# Patient Record
Sex: Female | Born: 1965 | Race: Black or African American | Hispanic: No | Marital: Single | Smoking: Former smoker
Health system: Southern US, Community
[De-identification: ages and names within clinical notes are randomized; demographics above are authoritative.]

## PROBLEM LIST (undated history)

## (undated) DIAGNOSIS — L309 Dermatitis, unspecified: Secondary | ICD-10-CM

## (undated) DIAGNOSIS — F419 Anxiety disorder, unspecified: Secondary | ICD-10-CM

## (undated) DIAGNOSIS — Z923 Personal history of irradiation: Secondary | ICD-10-CM

## (undated) DIAGNOSIS — N6310 Unspecified lump in the right breast, unspecified quadrant: Secondary | ICD-10-CM

## (undated) HISTORY — PX: ELBOW SURGERY: SHX618

## (undated) HISTORY — PX: TUBAL LIGATION: SHX77

---

## 2005-07-04 ENCOUNTER — Ambulatory Visit: Payer: Self-pay | Admitting: Internal Medicine

## 2005-08-03 ENCOUNTER — Ambulatory Visit: Payer: Self-pay | Admitting: Internal Medicine

## 2006-02-20 ENCOUNTER — Ambulatory Visit: Payer: Self-pay | Admitting: Internal Medicine

## 2006-09-06 ENCOUNTER — Inpatient Hospital Stay (HOSPITAL_COMMUNITY): Admission: AD | Admit: 2006-09-06 | Discharge: 2006-09-06 | Payer: Self-pay | Admitting: Obstetrics & Gynecology

## 2006-09-08 ENCOUNTER — Inpatient Hospital Stay (HOSPITAL_COMMUNITY): Admission: AD | Admit: 2006-09-08 | Discharge: 2006-09-08 | Payer: Self-pay | Admitting: Family Medicine

## 2006-09-09 ENCOUNTER — Encounter (INDEPENDENT_AMBULATORY_CARE_PROVIDER_SITE_OTHER): Payer: Self-pay | Admitting: *Deleted

## 2006-09-09 ENCOUNTER — Ambulatory Visit (HOSPITAL_COMMUNITY): Admission: AD | Admit: 2006-09-09 | Discharge: 2006-09-09 | Payer: Self-pay | Admitting: Family Medicine

## 2007-01-08 ENCOUNTER — Inpatient Hospital Stay (HOSPITAL_COMMUNITY): Admission: AD | Admit: 2007-01-08 | Discharge: 2007-01-08 | Payer: Self-pay | Admitting: Obstetrics

## 2007-01-12 ENCOUNTER — Ambulatory Visit (HOSPITAL_COMMUNITY): Admission: RE | Admit: 2007-01-12 | Discharge: 2007-01-12 | Payer: Self-pay | Admitting: Obstetrics

## 2007-01-12 ENCOUNTER — Encounter (INDEPENDENT_AMBULATORY_CARE_PROVIDER_SITE_OTHER): Payer: Self-pay | Admitting: Specialist

## 2007-05-22 ENCOUNTER — Ambulatory Visit (HOSPITAL_COMMUNITY): Admission: RE | Admit: 2007-05-22 | Discharge: 2007-05-22 | Payer: Self-pay | Admitting: Obstetrics

## 2007-08-28 ENCOUNTER — Inpatient Hospital Stay (HOSPITAL_COMMUNITY): Admission: AD | Admit: 2007-08-28 | Discharge: 2007-08-29 | Payer: Self-pay | Admitting: Obstetrics

## 2007-09-01 ENCOUNTER — Inpatient Hospital Stay (HOSPITAL_COMMUNITY): Admission: AD | Admit: 2007-09-01 | Discharge: 2007-09-01 | Payer: Self-pay | Admitting: Obstetrics

## 2007-10-01 ENCOUNTER — Inpatient Hospital Stay (HOSPITAL_COMMUNITY): Admission: AD | Admit: 2007-10-01 | Discharge: 2007-10-11 | Payer: Self-pay | Admitting: Obstetrics

## 2007-10-09 ENCOUNTER — Encounter (INDEPENDENT_AMBULATORY_CARE_PROVIDER_SITE_OTHER): Payer: Self-pay | Admitting: Obstetrics

## 2007-10-10 ENCOUNTER — Encounter (INDEPENDENT_AMBULATORY_CARE_PROVIDER_SITE_OTHER): Payer: Self-pay | Admitting: Obstetrics

## 2008-01-26 ENCOUNTER — Emergency Department (HOSPITAL_COMMUNITY): Admission: EM | Admit: 2008-01-26 | Discharge: 2008-01-26 | Payer: Self-pay | Admitting: Family Medicine

## 2008-08-22 ENCOUNTER — Encounter: Admission: RE | Admit: 2008-08-22 | Discharge: 2008-08-22 | Payer: Self-pay | Admitting: Internal Medicine

## 2008-10-18 ENCOUNTER — Emergency Department (HOSPITAL_BASED_OUTPATIENT_CLINIC_OR_DEPARTMENT_OTHER): Admission: EM | Admit: 2008-10-18 | Discharge: 2008-10-18 | Payer: Self-pay | Admitting: Emergency Medicine

## 2008-11-13 ENCOUNTER — Encounter: Admission: RE | Admit: 2008-11-13 | Discharge: 2008-11-13 | Payer: Self-pay | Admitting: Internal Medicine

## 2010-03-12 ENCOUNTER — Encounter: Admission: RE | Admit: 2010-03-12 | Discharge: 2010-03-12 | Payer: Self-pay | Admitting: Internal Medicine

## 2010-03-30 ENCOUNTER — Ambulatory Visit: Payer: Self-pay | Admitting: Diagnostic Radiology

## 2010-03-30 ENCOUNTER — Emergency Department (HOSPITAL_BASED_OUTPATIENT_CLINIC_OR_DEPARTMENT_OTHER): Admission: EM | Admit: 2010-03-30 | Discharge: 2010-03-30 | Payer: Self-pay | Admitting: Emergency Medicine

## 2011-04-12 NOTE — Op Note (Signed)
NAMESELYNA, Cassidy Mayer               ACCOUNT NO.:  [id number]   MEDICAL RECORD NO.:  [id number]          PATIENT TYPE:  INP   LOCATION:  9307                          FACILITY:  [facility]   PHYSICIAN:  Kathreen Cosier, M.D.DATE OF BIRTH:  1966-09-12   DATE OF PROCEDURE:  10/10/2007  DATE OF DISCHARGE:                               OPERATIVE REPORT   PREOPERATIVE DIAGNOSIS:  Multiparity.   POSTOPERATIVE DIAGNOSIS:  Multiparity.   DESCRIPTION OF PROCEDURE:  The patient was placed on the operating table  in the supine position.  General anesthesia administered by Dr. Jean Rosenthal.  Abdomen prepped and draped.  Bladder emptied with straight catheter.  A  midline 1 inch incision subumbilically was made and carried down to the  fascia.  The fascia was cleaned and grasped with two Kochers.  The  fascia and the peritoneum opened with the Mayo scissors.  The left tube  grasped in the midportion with a Babcock clamp.  0 plain suture placed  in the mesosalpinx below the portion of the tube within the clamp.  This  was tied and the tube traced to the fimbria.  Approximately one inch of  tube was transected.  Hemostasis was satisfactory.  The procedure was  done in a similar fashion on the other side.  Lap and sponge counts  correct, abdomen closed in layers.  Peritoneum and fascia with  continuous suture of 0 Dexon.  The skin was closed with subcuticular  stitch of 4-0 Monocryl.  Estimated blood loss was minimal.  The patient  tolerated the procedure well and was taken to the recovery room in good  condition.           ______________________________  Kathreen Cosier, M.D.     BAM/MEDQ  D:  10/10/2007  T:  10/10/2007  Job:  [id number]

## 2011-04-12 NOTE — Op Note (Signed)
Cassidy Mayer, [name]               ACCOUNT NO.:  [id number]   MEDICAL RECORD NO.:  [id number]          PATIENT TYPE:  AMB   LOCATION:  SDC                           [facility]:  [facility]   PHYSICIAN:  Kathreen Cosier, M.D.DATE OF BIRTH:  16-Jul-1966   DATE OF PROCEDURE:  05/22/2007  DATE OF DISCHARGE:                               OPERATIVE REPORT   PREOPERATIVE DIAGNOSIS:  Incompetent cervix.   PROCEDURE:  Cervical cerclage.   ANESTHESIA:  Spinal.   PROCEDURE NOTE:  The patient was placed in the lithotomy position.  Perineum and vagina prepped and draped. Bladder emptied with a straight  catheter. Weighted speculum placed in the vagina starting at 12 o'clock  high up on the lateral aspect of the cervix using a #5 Mersilene band  __________  suture was placed in the usual manner and tied at 6 o'clock.  The patient tolerated the procedure well and was taken to the recovery  room in good condition.           ______________________________  Kathreen Cosier, M.D.     BAM/MEDQ  D:  05/22/2007  T:  05/22/2007  Job:  [id number]

## 2011-04-12 NOTE — Discharge Summary (Signed)
Cassidy Mayer, Cassidy Mayer               ACCOUNT NO.:  [id number]   MEDICAL RECORD NO.:  [id number]          PATIENT TYPE:  INP   LOCATION:  9307                          [facility]   PHYSICIAN:  Kathreen Cosier, M.D.DATE OF BIRTH:  1965-12-02   DATE OF ADMISSION:  10/01/2007  DATE OF DISCHARGE:  10/11/2007                               DISCHARGE SUMMARY   HOSPITAL COURSE:  The patient is a 45 year old gravida 5, para 1-1-2-2  with EDC of November 17, 2007.  She was admitted at 32 weeks with  ruptured membranes.  The fluid was clear, and she had 3.7 to 1 LSPG.  The cerclage was removed on admission, and she was given dexamethasone 6  mg IM q.12 h. times four.  She was also placed on magnesium sulfate.  Her cervix was 3 cm.  She also has a history of ulcers and is on  Carafate 4 mg q.i.d., Pepcid 20 mg b.i.d.  She also suffers from  anxiety.  She was on Xanax 0.5 t.i.d.  The patient continued leaking and  remained afebrile.  Her hemoglobin was 10.5.  Platelet counts were noted  to be 81,000 down from 152,000 early in her pregnancy.  She went into  labor on October 09, 2007 and had a normal vaginal delivery of female  with Apgars of 8 and 9, weighing 3 pounds 14 ounces.  The patient  desired sterilization and on November 12 underwent postpartum tubal  ligation.  She did fine, and was discharged home on October 11, 2007,  ambulatory on a regular diet on Tylox for pain and Xanax 0.5 t.i.d. for  anxiety.   DISCHARGE DIAGNOSIS:  1. Status post normal vaginal delivery at 34 weeks, premature ruptured      membranes.  2. Postpartum tubal ligation.           ______________________________  Kathreen Cosier, M.D.     BAM/MEDQ  D:  10/11/2007  T:  10/11/2007  Job:  [id number]

## 2011-04-15 NOTE — Op Note (Signed)
NAMELILLIANNE, Cassidy Mayer               ACCOUNT NO.:  [id number]   MEDICAL RECORD NO.:  [id number]          PATIENT TYPE:  AMB   LOCATION:  SDC                           [facility]   PHYSICIAN:  Juan H. Lily Peer, M.D.DATE OF BIRTH:  1966/02/27   DATE OF PROCEDURE:  09/09/2006  DATE OF DISCHARGE:  09/09/2006                                 OPERATIVE REPORT   INDICATIONS FOR OPERATION:  A 45 year old gravida 3, para 2, at 6-1/[redacted] weeks  gestation with evidence of a missed AB, leveling off of quantitative beta  HCGs, gestational sac in the lower intrauterine cavity irregular, and  patient complaining of bleeding and cramping.   PREOPERATIVE DIAGNOSIS:  First trimester missed abortion.   POSTOPERATIVE DIAGNOSIS:  First trimester missed abortion.   ANESTHESIA:  Intravenous sedation (MAC).   PROCEDURE PERFORMED:  Dilatation and evacuation.   DESCRIPTION OF PROCEDURE:  After the patient was adequately counseled, she  was taken to the operating room, where she underwent intravenous sedation.  The patient received 2 gm of Ancef prophylactically.  She had been bleeding  and cramping for several days.  She had voided prior to coming to the  operating room.  Examination demonstrated the uterus at upper limits of  normal, retroverted, with no palpable adnexal masses.  The vagina and  perineum were prepped and draped in the usual sterile fashion.  A Graves  speculum was inserted into the vaginal vault.  A single-tooth tenaculum was  placed into the anterior cervical lip.  The uterus sounded to approximately  8 cm and was dilated with Shawnie Pons dilators in an effort to allow a 9 mm  suction curette to be introduced into the intrauterine cavity.  Prior to  this, 2% lidocaine with 1:100,000 epinephrine was infiltrated into the  cervical vaginal stroma at the 2, 4, 8, and 10 o'clock position for a total  of 10 cc.  The 9 mm suction curette had been introduced into the  intrauterine cavity, and the  products of conception were removed.  This was  interchanged with a serrated curette to completely evacuate the uterus of  its contents.  The patient tolerated the procedure well, was transferred to  the recovery room with stable vital signs.  Blood loss was minimal.  IV  fluids, 900 cc of lactated Ringer's, and her blood type was O+.      Juan H. Lily Peer, M.D.  Electronically Signed     JHF/MEDQ  D:  09/09/2006  T:  09/11/2006  Job:  [id number]

## 2011-04-15 NOTE — Op Note (Signed)
NAMETAYTEN, HEBER               ACCOUNT NO.:  [id number]   MEDICAL RECORD NO.:  [id number]          PATIENT TYPE:  AMB   LOCATION:  SDC                           [facility]   PHYSICIAN:  Kathreen Cosier, M.D.DATE OF BIRTH:  June 10, 1966   DATE OF PROCEDURE:  01/12/2007  DATE OF DISCHARGE:                               OPERATIVE REPORT   PREOPERATIVE DIAGNOSIS:  Six week fetal demise.   PROCEDURE:  Dilation and evacuation, using MAC.   Patient in lithotomy position.  Perineum and vagina prepped and draped.  Bladder emptied with a straight catheter.  Bimanual exam revealed uterus  to be retroverted, normal uterus.  A large amount of clots and tissue in  the vagina.  The cervix was noted to be wide open and easily admitted a  #9 suction, and the endometrial cavity __________ .  Patient tolerated  the procedure well, taken to the recovery room in good condition.           ______________________________  Kathreen Cosier, M.D.     BAM/MEDQ  D:  01/12/2007  T:  01/12/2007  Job:  [id number]

## 2011-09-06 LAB — CBC
HCT: 32.3 — ABNORMAL LOW
HCT: 34.5 — ABNORMAL LOW
Hemoglobin: 11.5 — ABNORMAL LOW
MCHC: 33.4
MCV: 86.5
MCV: 86.6
Platelets: 80 — ABNORMAL LOW
RBC: 3.73 — ABNORMAL LOW
RBC: 3.99
RDW: 13.8
WBC: 10.3
WBC: 5.7

## 2011-09-06 LAB — COMPREHENSIVE METABOLIC PANEL
AST: 22
Albumin: 2.3 — ABNORMAL LOW
Calcium: 8.3 — ABNORMAL LOW
Chloride: 104
Creatinine, Ser: 0.57
GFR calc Af Amer: >60
Total Protein: 5.7 — ABNORMAL LOW

## 2011-09-06 LAB — LSPG (L/S RATIO WITH PG)-AMNIO FLUID

## 2011-09-06 LAB — RPR TITER: RPR Titer: 1:2 {titer} — AB

## 2011-09-06 LAB — RPR: RPR Ser Ql: REACTIVE — AB

## 2011-09-14 LAB — CBC
HCT: 36
Hemoglobin: 12.2
MCV: 82.7
Platelets: 155
RDW: 12.2

## 2011-09-23 LAB — TPPA: Treponema Confirm: NONREACTIVE

## 2012-02-14 ENCOUNTER — Other Ambulatory Visit: Payer: Self-pay | Admitting: Internal Medicine

## 2012-02-14 ENCOUNTER — Ambulatory Visit
Admission: RE | Admit: 2012-02-14 | Discharge: 2012-02-14 | Disposition: A | Discharge status: Home / Self Care | Payer: Medicaid Other | Source: Ambulatory Visit | Attending: Internal Medicine | Admitting: Internal Medicine

## 2012-02-14 DIAGNOSIS — F17200 Nicotine dependence, unspecified, uncomplicated: Secondary | ICD-10-CM

## 2012-02-14 DIAGNOSIS — M549 Dorsalgia, unspecified: Secondary | ICD-10-CM

## 2014-12-25 ENCOUNTER — Encounter (HOSPITAL_BASED_OUTPATIENT_CLINIC_OR_DEPARTMENT_OTHER): Payer: Self-pay | Admitting: *Deleted

## 2014-12-25 ENCOUNTER — Emergency Department (HOSPITAL_BASED_OUTPATIENT_CLINIC_OR_DEPARTMENT_OTHER)
Admission: EM | Admit: 2014-12-25 | Discharge: 2014-12-25 | Disposition: A | Discharge status: Home / Self Care | Payer: BLUE CROSS/BLUE SHIELD | Attending: Emergency Medicine | Admitting: Emergency Medicine

## 2014-12-25 ENCOUNTER — Emergency Department (HOSPITAL_BASED_OUTPATIENT_CLINIC_OR_DEPARTMENT_OTHER): Payer: BLUE CROSS/BLUE SHIELD

## 2014-12-25 DIAGNOSIS — L03011 Cellulitis of right finger: Secondary | ICD-10-CM | POA: Insufficient documentation

## 2014-12-25 HISTORY — DX: Dermatitis, unspecified: L30.9

## 2014-12-25 LAB — CBC
HCT: 41.3 % (ref 36.0–46.0)
HEMOGLOBIN: 13.2 g/dL (ref 12.0–15.0)
MCH: 27.4 pg (ref 26.0–34.0)
MCHC: 32 g/dL (ref 30.0–36.0)
MCV: 85.9 fL (ref 78.0–100.0)
Platelets: 151 10*3/uL (ref 150–400)
RBC: 4.81 MIL/uL (ref 3.87–5.11)
RDW: 14.6 % (ref 11.5–15.5)
WBC: 4.5 10*3/uL (ref 4.0–10.5)

## 2014-12-25 LAB — BASIC METABOLIC PANEL
Anion gap: 4 — ABNORMAL LOW (ref 5–15)
BUN: 20 mg/dL (ref 6–23)
CO2: 23 mmol/L (ref 19–32)
Calcium: 9.3 mg/dL (ref 8.4–10.5)
Chloride: 106 mmol/L (ref 96–112)
Creatinine, Ser: 0.84 mg/dL (ref 0.50–1.10)
GFR, EST NON AFRICAN AMERICAN: 81 mL/min — AB (ref >90)
Glucose, Bld: 91 mg/dL (ref 70–99)
POTASSIUM: 3.6 mmol/L (ref 3.5–5.1)
SODIUM: 133 mmol/L — AB (ref 135–145)

## 2014-12-25 MED ORDER — CLINDAMYCIN HCL 300 MG PO CAPS: 300.0000 mg | ORAL_CAPSULE | Freq: Four times a day (QID) | ORAL | Status: DC

## 2014-12-25 MED ORDER — CLINDAMYCIN PHOSPHATE 600 MG/50ML IV SOLN
600.0000 mg | Freq: Once | INTRAVENOUS | Status: AC
  Administered 2014-12-25: 600 mg via INTRAVENOUS
  Filled 2014-12-25: qty 50

## 2014-12-25 NOTE — Discharge Instructions (Signed)
Take clindamycin as correct it for 1 week. It is very important to complete this entire course of the antibiotic. Follow-up with your primary care physician in 24 hours for reevaluation. Return to the emergency department if the redness spreads beyond the skin markings that were drawn today.  Cellulitis Cellulitis is an infection of the skin and the tissue beneath it. The infected area is usually red and tender. Cellulitis occurs most often in the arms and lower legs.  CAUSES  Cellulitis is caused by bacteria that enter the skin through cracks or cuts in the skin. The most common types of bacteria that cause cellulitis are staphylococci and streptococci. SIGNS AND SYMPTOMS   Redness and warmth.  Swelling.  Tenderness or pain.  Fever. DIAGNOSIS  Your health care provider can usually determine what is wrong based on a physical exam. Blood tests may also be done. TREATMENT  Treatment usually involves taking an antibiotic medicine. HOME CARE INSTRUCTIONS   Take your antibiotic medicine as directed by your health care provider. Finish the antibiotic even if you start to feel better.  Keep the infected arm or leg elevated to reduce swelling.  Apply a warm cloth to the affected area up to 4 times per day to relieve pain.  Take medicines only as directed by your health care provider.  Keep all follow-up visits as directed by your health care provider. SEEK MEDICAL CARE IF:   You notice red streaks coming from the infected area.  Your red area gets larger or turns dark in color.  Your bone or joint underneath the infected area becomes painful after the skin has healed.  Your infection returns in the same area or another area.  You notice a swollen bump in the infected area.  You develop new symptoms.  You have a fever. SEEK IMMEDIATE MEDICAL CARE IF:   You feel very sleepy.  You develop vomiting or diarrhea.  You have a general ill feeling (malaise) with muscle aches and  pains. MAKE SURE YOU:   Understand these instructions.  Will watch your condition.  Will get help right away if you are not doing well or get worse. Document Released: 08/24/2005 Document Revised: 03/31/2014 Document Reviewed: 01/30/2012 [facility] Patient Information 2015 [place], [place]. This information is not intended to replace advice given to you by your health care provider. Make sure you discuss any questions you have with your health care provider.

## 2014-12-25 NOTE — ED Notes (Signed)
Swelling in her right thumb started 3 days ago. Swelling, hot to touch, painful, with red streaks going up her arm.

## 2014-12-25 NOTE — ED Provider Notes (Signed)
CSN: [id number]     Arrival date & time 12/25/14  1334 History   First MD Initiated Contact with Patient 12/25/14 1348     Chief Complaint  Patient presents with  . Cellulitis     (Consider location/radiation/quality/duration/timing/severity/associated sxs/prior Treatment) HPI Comments: 49 year old female presenting to the emergency department with right thumb swelling 3 days, worsening today. 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 No known injury, trauma or open wounds. States her thumb has been itchy and she has been scratching it and applying hydrocortisone with minimal relief. States the area feels warm but is not painful. Denies any fevers.  The history is provided by the patient.    Past Medical History  Diagnosis Date  . Eczema    Past Surgical History  Procedure Laterality Date  . Elbow surgery    . Tubal ligation     No family history on file. History  Substance Use Topics  . Smoking status: Not on file  . Smokeless tobacco: Not on file  . Alcohol Use: Not on file   OB History    No data available     Review of Systems  Musculoskeletal: Positive for joint swelling.  Skin: Positive for color change.  All other systems reviewed and are negative.     Allergies  Review of patient's allergies indicates no known allergies.  Home Medications   Prior to Admission medications   Medication Sig Start Date End Date Taking? Authorizing Provider  clindamycin (CLEOCIN) 300 MG capsule Take 1 capsule (300 mg total) by mouth 4 (four) times daily. X 7 days 12/25/14   Hessie Diener Kadir Azucena, PA-C   BP 135/75 mmHg  Pulse 72  Temp(Src) 98.6 F (37 C) (Oral)  Resp 18  Ht 5\' 4"  (1.626 m)  Wt 129 lb (58.514 kg)  BMI 22.13 kg/m2  SpO2 100%  LMP 12/11/2014 Physical Exam  Constitutional: She is oriented to person, place, and time. She appears  well-developed and well-nourished. No distress.  HENT:  Head: Normocephalic and atraumatic.  Mouth/Throat: Oropharynx is clear and moist.  Eyes: Conjunctivae and EOM are normal.  Neck: Normal range of motion. Neck supple.  Cardiovascular: Normal rate, regular rhythm and normal heart sounds.   +2 radial pulse on right.  Pulmonary/Chest: Effort normal and breath sounds normal. No respiratory distress.  Musculoskeletal: Normal range of motion.  R hand with redness, swelling and mild warmth over right MCP and proximal phalanx into the base of her hand with streaking up anterior forearm to below antecubital fossa. Skin markings drawn prior to arrival. FROM right thumb and hand without pain.  Neurological: She is alert and oriented to person, place, and time. No sensory deficit.  Skin: Skin is warm and dry.  Psychiatric: She has a normal mood and affect. Her behavior is normal.  Nursing note and vitals reviewed.   ED Course  Procedures (including critical care time) Labs Review Labs Reviewed  BASIC METABOLIC PANEL - Abnormal; Notable for the following:    Sodium 133 (*)    GFR calc non Af Amer 81 (*)    Anion gap 4 (*)    All other components within normal limits  CBC    Imaging Review Dg Finger Thumb Right  12/25/2014   CLINICAL DATA:  Right thumb swelling for 3 days.  No known injury.  EXAM: RIGHT  THUMB 2+V  COMPARISON:  None.  FINDINGS: Soft tissues about the right thumb appear swollen. No radiopaque foreign body or soft tissue gas collection is identified. Image bones and joints appear normal.  IMPRESSION: Soft tissue swelling.  Otherwise negative.   Electronically Signed   By: Inge Rise M.D.   On: 12/25/2014 14:42     EKG Interpretation None      MDM   Final diagnoses:  Cellulitis of right thumb   Patient in no apparent distress. Afebrile, vital signs stable. Redness and swelling noted to right thumb. She has full range of motion of right thumb, hand and wrist. There  is no associated pain. Neurovascularly intact. X-ray without any acute finding. Doubt septic joint. No leukocytosis. Patient given a dose of IV clindamycin. The redness has not spread beyond the skin markings drawn prior to arrival. I do not feel patient requires inpatient treatment for cellulitis. She will be discharged home with PO clindamycin. Follow-up with PCP in 24 hours for reevaluation. Stable for discharge. Return precautions given. Patient states understanding of treatment care plan and is agreeable.  Discussed with attending Dr. Leonides Schanz who agrees with plan of care.   Carman Ching, PA-C 12/25/14 [place], DO 12/25/14 1546

## 2014-12-25 NOTE — ED Notes (Signed)
No wound noted on the R thumb an no broken skin noted on the R inner arm

## 2015-02-14 ENCOUNTER — Emergency Department (HOSPITAL_BASED_OUTPATIENT_CLINIC_OR_DEPARTMENT_OTHER): Payer: BLUE CROSS/BLUE SHIELD

## 2015-02-14 ENCOUNTER — Emergency Department (HOSPITAL_BASED_OUTPATIENT_CLINIC_OR_DEPARTMENT_OTHER)
Admission: EM | Admit: 2015-02-14 | Discharge: 2015-02-14 | Disposition: A | Discharge status: Home / Self Care | Payer: BLUE CROSS/BLUE SHIELD | Attending: Emergency Medicine | Admitting: Emergency Medicine

## 2015-02-14 ENCOUNTER — Encounter (HOSPITAL_BASED_OUTPATIENT_CLINIC_OR_DEPARTMENT_OTHER): Payer: Self-pay | Admitting: Emergency Medicine

## 2015-02-14 DIAGNOSIS — Z792 Long term (current) use of antibiotics: Secondary | ICD-10-CM | POA: Diagnosis not present

## 2015-02-14 DIAGNOSIS — L03011 Cellulitis of right finger: Secondary | ICD-10-CM | POA: Insufficient documentation

## 2015-02-14 DIAGNOSIS — R52 Pain, unspecified: Secondary | ICD-10-CM

## 2015-02-14 DIAGNOSIS — M79641 Pain in right hand: Secondary | ICD-10-CM | POA: Diagnosis present

## 2015-02-14 DIAGNOSIS — L03113 Cellulitis of right upper limb: Secondary | ICD-10-CM

## 2015-02-14 MED ORDER — NAPROXEN 250 MG PO TABS: 500.0000 mg | ORAL_TABLET | Freq: Once | ORAL | Status: DC

## 2015-02-14 MED ORDER — CLINDAMYCIN HCL 150 MG PO CAPS
300.0000 mg | ORAL_CAPSULE | Freq: Once | ORAL | Status: AC
  Administered 2015-02-14: 300 mg via ORAL
  Filled 2015-02-14: qty 2

## 2015-02-14 MED ORDER — NAPROXEN 375 MG PO TABS: 375.0000 mg | ORAL_TABLET | Freq: Two times a day (BID) | ORAL | Status: DC

## 2015-02-14 MED ORDER — CEFTRIAXONE SODIUM 1 G IJ SOLR
1.0000 g | Freq: Once | INTRAMUSCULAR | Status: AC
  Administered 2015-02-14: 1 g via INTRAMUSCULAR
  Filled 2015-02-14: qty 10

## 2015-02-14 MED ORDER — CLINDAMYCIN HCL 300 MG PO CAPS: 300.0000 mg | ORAL_CAPSULE | Freq: Four times a day (QID) | ORAL | Status: DC

## 2015-02-14 NOTE — ED Provider Notes (Addendum)
CSN: [id number]     Arrival date & time 02/14/15  0105 History   First MD Initiated Contact with Patient 02/14/15 0358     Chief Complaint  Patient presents with  . Extremity Pain     (Consider location/radiation/quality/duration/timing/severity/associated sxs/prior Treatment) Patient is a 49 y.o. female presenting with hand pain. The history is provided by the patient.  Hand Pain This is a recurrent problem. The current episode started more than 2 days ago. The problem occurs constantly. The problem has not changed since onset.Pertinent negatives include no chest pain, no abdominal pain, no headaches and no shortness of breath. Nothing aggravates the symptoms. Nothing relieves the symptoms. She has tried nothing for the symptoms. The treatment provided no relief.  Had cellulitis of the right thumb December 25, 2014.  Was placed on clindamycin and symptoms resolved and have now recurred  Past Medical History  Diagnosis Date  . Eczema    Past Surgical History  Procedure Laterality Date  . Elbow surgery    . Tubal ligation     No family history on file. History  Substance Use Topics  . Smoking status: Not on file  . Smokeless tobacco: Not on file  . Alcohol Use: Not on file   OB History    No data available     Review of Systems  Constitutional: Negative for fever.  Respiratory: Negative for shortness of breath.   Cardiovascular: Negative for chest pain.  Gastrointestinal: Negative for abdominal pain.  Musculoskeletal: Negative for joint swelling.  Skin: Positive for color change.  Neurological: Negative for headaches.  All other systems reviewed and are negative.     Allergies  Review of patient's allergies indicates no known allergies.  Home Medications   Prior to Admission medications   Medication Sig Start Date End Date Taking? Authorizing Provider  clindamycin (CLEOCIN) 300 MG capsule Take 1 capsule (300 mg total) by mouth 4 (four) times daily. X 7 days  12/25/14   Carman Ching, PA-C   BP 116/88 mmHg  Temp(Src) 98.1 F (36.7 C) (Oral)  Resp 16  Ht 5\' 4"  (1.626 m)  Wt 128 lb (58.06 kg)  BMI 21.96 kg/m2  SpO2 100%  LMP 01/06/2015 Physical Exam  Constitutional: She is oriented to person, place, and time. She appears well-developed and well-nourished. No distress.  HENT:  Head: Normocephalic and atraumatic.  Mouth/Throat: Oropharynx is clear and moist.  Eyes: EOM are normal. Pupils are equal, round, and reactive to light.  Neck: Normal range of motion. Neck supple.  Cardiovascular: Normal rate, regular rhythm and intact distal pulses.   Pulmonary/Chest: Effort normal and breath sounds normal. No respiratory distress. She has no wheezes. She has no rales.  Abdominal: Soft. Bowel sounds are normal. There is no tenderness. There is no rebound and no guarding.  Musculoskeletal: Normal range of motion. She exhibits no edema or tenderness.       Right forearm: Normal. She exhibits no tenderness, no bony tenderness, no swelling, no edema, no deformity and no laceration.       Right hand: She exhibits normal range of motion, no tenderness, no bony tenderness, normal two-point discrimination, normal capillary refill, no deformity, no laceration and no swelling. Normal sensation noted. Normal strength noted.       Hands: Neurological: She is alert and oriented to person, place, and time.  Skin: Skin is warm and dry.    ED Course  Procedures (including critical care time) Labs Review Labs Reviewed - No data  to display  Imaging Review No results found.   EKG Interpretation None      MDM   Final diagnoses:  Pain    Refused XRay ordered by EDP.  There are no indications for labs at this time.  Patient in no apparent distress.  Well appearing reading in the room upon entrance.  Afebrile, vital signs stable. Redness and swelling noted to right thumb. She has full range of motion of right thumb, hand and wrist. There is no associated  pain. Neurovascularly intact.  Doubt septic joint. IM Rocephin given and started on PO clindamycin in the ED.  She will be discharged home with PO clindamycin. Follow-up with PCP in 24 hours for reevaluation. Stable for discharge. Return precautions given. Patient states understanding of treatment care plan and is agreeable    Nilsa Macht, MD 02/14/15 0453  Malachi Kinzler, MD 02/14/15 [phone]

## 2015-02-14 NOTE — ED Notes (Signed)
Pt sts tenderness to R thumb, R 4th digit, recently had streaking infection up r arm- unkown what caused it.

## 2016-01-07 ENCOUNTER — Emergency Department (HOSPITAL_COMMUNITY): Payer: BLUE CROSS/BLUE SHIELD

## 2016-01-07 ENCOUNTER — Encounter (HOSPITAL_COMMUNITY): Payer: Self-pay

## 2016-01-07 ENCOUNTER — Emergency Department (HOSPITAL_COMMUNITY)
Admission: EM | Admit: 2016-01-07 | Discharge: 2016-01-07 | Disposition: A | Discharge status: Home / Self Care | Payer: BLUE CROSS/BLUE SHIELD | Attending: Emergency Medicine | Admitting: Emergency Medicine

## 2016-01-07 DIAGNOSIS — Z872 Personal history of diseases of the skin and subcutaneous tissue: Secondary | ICD-10-CM | POA: Insufficient documentation

## 2016-01-07 DIAGNOSIS — R079 Chest pain, unspecified: Secondary | ICD-10-CM | POA: Diagnosis not present

## 2016-01-07 DIAGNOSIS — F17210 Nicotine dependence, cigarettes, uncomplicated: Secondary | ICD-10-CM | POA: Insufficient documentation

## 2016-01-07 DIAGNOSIS — Z91040 Latex allergy status: Secondary | ICD-10-CM | POA: Diagnosis not present

## 2016-01-07 LAB — BASIC METABOLIC PANEL
Anion gap: 11 (ref 5–15)
CALCIUM: 9.5 mg/dL (ref 8.9–10.3)
CO2: 22 mmol/L (ref 22–32)
CREATININE: 0.64 mg/dL (ref 0.44–1.00)
Chloride: 106 mmol/L (ref 101–111)
GFR calc Af Amer: >60 mL/min (ref >60)
GLUCOSE: 93 mg/dL (ref 65–99)
Potassium: 3.8 mmol/L (ref 3.5–5.1)
Sodium: 139 mmol/L (ref 135–145)

## 2016-01-07 LAB — CBC
HCT: 38.9 % (ref 36.0–46.0)
Hemoglobin: 12.9 g/dL (ref 12.0–15.0)
MCH: 27.7 pg (ref 26.0–34.0)
MCHC: 33.2 g/dL (ref 30.0–36.0)
MCV: 83.5 fL (ref 78.0–100.0)
Platelets: 163 10*3/uL (ref 150–400)
RBC: 4.66 MIL/uL (ref 3.87–5.11)
RDW: 14.2 % (ref 11.5–15.5)
WBC: 4.8 10*3/uL (ref 4.0–10.5)

## 2016-01-07 LAB — I-STAT TROPONIN, ED
TROPONIN I, POC: 0 ng/mL (ref 0.00–0.08)
Troponin i, poc: 0 ng/mL (ref 0.00–0.08)

## 2016-01-07 MED ORDER — METOCLOPRAMIDE HCL 10 MG PO TABS
10.0000 mg | ORAL_TABLET | Freq: Once | ORAL | Status: AC
  Administered 2016-01-07: 10 mg via ORAL
  Filled 2016-01-07: qty 1

## 2016-01-07 NOTE — Discharge Instructions (Signed)
Nonspecific Chest Pain Follow up with your primary care physician. Return for increased chest pain, diaphoresis, shortness of breath. Talk to your doctor about how to quit smoking. It is often hard to find the cause of chest pain. There is always a chance that your pain could be related to something serious, such as a heart attack or a blood clot in your lungs. Chest pain can also be caused by conditions that are not life-threatening. If you have chest pain, it is very important to follow up with your doctor.  HOME CARE  If you were prescribed an antibiotic medicine, finish it all even if you start to feel better.  Avoid any activities that cause chest pain.  Do not use any tobacco products, including cigarettes, chewing tobacco, or electronic cigarettes. If you need help quitting, ask your doctor.  Do not drink alcohol.  Take medicines only as told by your doctor.  Keep all follow-up visits as told by your doctor. This is important. This includes any further testing if your chest pain does not go away.  Your doctor may tell you to keep your head raised (elevated) while you sleep.  Make lifestyle changes as told by your doctor. These may include:  Getting regular exercise. Ask your doctor to suggest some activities that are safe for you.  Eating a heart-healthy diet. Your doctor or a diet specialist (dietitian) can help you to learn healthy eating options.  Maintaining a healthy weight.  Managing diabetes, if necessary.  Reducing stress. GET HELP IF:  Your chest pain does not go away, even after treatment.  You have a rash with blisters on your chest.  You have a fever. GET HELP RIGHT AWAY IF:  Your chest pain is worse.  You have an increasing cough, or you cough up blood.  You have severe belly (abdominal) pain.  You feel extremely weak.  You pass out (faint).  You have chills.  You have sudden, unexplained chest discomfort.  You have sudden, unexplained  discomfort in your arms, back, neck, or jaw.  You have shortness of breath at any time.  You suddenly start to sweat, or your skin gets clammy.  You feel nauseous.  You vomit.  You suddenly feel light-headed or dizzy.  Your heart begins to beat quickly, or it feels like it is skipping beats. These symptoms may be an emergency. Do not wait to see if the symptoms will go away. Get medical help right away. Call your local emergency services (911 in the [place] Do not drive yourself to the hospital.   This information is not intended to replace advice given to you by your health care provider. Make sure you discuss any questions you have with your health care provider.   Document Released: 05/02/2008 Document Revised: 12/05/2014 Document Reviewed: 06/20/2014 Elsevier Interactive Patient Education [employer].

## 2016-01-07 NOTE — ED Notes (Signed)
Pt presents with sudden onset of mid-scapular pain that began this morning, reports pain has been constant and radiates into her chest and neck.  Per EMS, pt was hyperventilating on scene, was given zofran 4mg  with some relief.

## 2016-01-07 NOTE — ED Provider Notes (Signed)
CSN: [id number]     Arrival date & time 01/07/16  0711 History   First MD Initiated Contact with Patient 01/07/16 [phone]     Chief complaint: Chest pain  (Consider location/radiation/quality/duration/timing/severity/associated sxs/prior Treatment) The history is provided by the patient. No language interpreter was used.    Cassidy Mayer is a 50 y.o female with no significant past medical history who presents with sudden onset mid scapular pain that began this morning while she was getting her daughter ready for school. She reports that the pain is constant and radiates to her chest and neck. She states that she had to lie on the floor secondary to pain. Per EMS, she was hyperventilating at the scene and was given Zofran. Denies any previous episodes similar to this. He denies any recent illness, fall, strenuous activity, cough, shortness of breath, diaphoresis, abdominal pain, nausea, vomiting. Denies any recent travel.  Past Medical History  Diagnosis Date  . Eczema    Past Surgical History  Procedure Laterality Date  . Elbow surgery    . Tubal ligation     History reviewed. No pertinent family history. Social History  Substance Use Topics  . Smoking status: Current Every Day Smoker -- 0.50 packs/day    Types: Cigars  . Smokeless tobacco: None  . Alcohol Use: None   OB History    No data available     Review of Systems  Constitutional: Negative for fever, chills and diaphoresis.  Respiratory: Negative for shortness of breath.   Cardiovascular: Positive for chest pain. Negative for palpitations and leg swelling.  All other systems reviewed and are negative.     Allergies  Latex  Home Medications   Prior to Admission medications   Medication Sig Start Date End Date Taking? Authorizing Provider  naproxen (NAPROSYN) 375 MG tablet Take 1 tablet (375 mg total) by mouth 2 (two) times daily. Patient not taking: Reported on 01/07/2016 02/14/15   April Palumbo, MD   BP 107/70 mmHg   Pulse 84  Temp(Src) 98.4 F (36.9 C) (Oral)  Resp 18  Ht 5\' 4"  (1.626 m)  Wt 58.06 kg  BMI 21.96 kg/m2  SpO2 99%  LMP 01/07/2016 (Exact Date) Physical Exam  Constitutional: She is oriented to person, place, and time. She appears well-developed and well-nourished.  HENT:  Head: Normocephalic and atraumatic.  Eyes: Conjunctivae are normal.  Neck: Normal range of motion. Neck supple.  Cardiovascular: Normal rate, regular rhythm and normal heart sounds.   No murmur heard. Regular rate and rhythm. No murmur. Lungs: Clear to auscultation bilaterally.  Pulmonary/Chest: Effort normal and breath sounds normal.  Abdominal: Soft. There is no tenderness.  Musculoskeletal: Normal range of motion.  No lower extremity edema or tenderness.  Neurological: She is alert and oriented to person, place, and time.  Skin: Skin is warm and dry.  Nursing note and vitals reviewed.   ED Course  Procedures (including critical care time) Labs Review Labs Reviewed  BASIC METABOLIC PANEL - Abnormal; Notable for the following:    BUN <5 (*)    All other components within normal limits  CBC  I-STAT TROPOININ, ED  Randolm Idol, ED    Imaging Review Dg Chest 2 View  01/07/2016  CLINICAL DATA:  Chest pain since this morning. EXAM: CHEST  2 VIEW COMPARISON:  02/14/2012 FINDINGS: Mild hyperinflation. Midline trachea. Normal heart size. Age advanced atherosclerosis in the transverse aorta. No pleural effusion or pneumothorax. Diffuse peribronchial thickening. Mild scarring at the left lung base. IMPRESSION:  No acute cardiopulmonary disease. Hyperinflation and chronic interstitial thickening, likely related to smoking/chronic bronchitis. Age advanced aortic atherosclerosis. Electronically Signed   By: Abigail Miyamoto M.D.   On: 01/07/2016 07:47   I have personally reviewed and evaluated these images and lab results as part of my medical decision-making.   EKG Interpretation   Date/Time:  Thursday January 07 2016 07:16:16 EST Ventricular Rate:  94 PR Interval:  160 QRS Duration: 86 QT Interval:  367 QTC Calculation: 459 R Axis:   63 Text Interpretation:  Sinus rhythm ST elev, probable normal early repol  pattern agree Confirmed by Johnney Killian, MD, Jeannie Done [phone]) on 01/07/2016 7:21:40  AM      MDM   Final diagnoses:  Chest pain, unspecified chest pain type   Patient presents for back pain that radiates to her chest and neck began suddenly while getting her daughter ready for school this morning. Vital signs are stable and she is well-appearing. Patient refused any pain medication. Labs are unremarkable. Troponin is negative. Chest x-ray is negative for pneumonia, pneumothorax, or edema. She has hyperinflation and chronic interstitial thickening related to smoking and chronic bronchitis. She also has age advanced aortic atherosclerosis. I discussed findings with the patient. Repeat troponin was obtained after 3 hours which is also negative. Patient is at low risk for major cardiac event after reviewing heart score. PERC negative.  Filed Vitals:   01/07/16 1030 01/07/16 1135  BP: 117/70 107/70  Pulse: 63 84  Temp:    Resp: 15 18   Medications  metoCLOPramide (REGLAN) tablet 10 mg (10 mg Oral Given 01/07/16 1128)       Ottie Glazier, PA-C 01/07/16 1536  Charlesetta Shanks, MD 01/08/16 [phone]

## 2016-05-30 ENCOUNTER — Other Ambulatory Visit: Payer: Self-pay | Admitting: Internal Medicine

## 2016-05-30 DIAGNOSIS — Z1231 Encounter for screening mammogram for malignant neoplasm of breast: Secondary | ICD-10-CM

## 2016-06-10 ENCOUNTER — Ambulatory Visit
Admission: RE | Admit: 2016-06-10 | Discharge: 2016-06-10 | Disposition: A | Discharge status: Home / Self Care | Payer: BLUE CROSS/BLUE SHIELD | Source: Ambulatory Visit | Attending: Internal Medicine | Admitting: Internal Medicine

## 2016-06-10 DIAGNOSIS — Z1231 Encounter for screening mammogram for malignant neoplasm of breast: Secondary | ICD-10-CM

## 2016-06-14 ENCOUNTER — Other Ambulatory Visit: Payer: Self-pay | Admitting: Internal Medicine

## 2016-06-14 DIAGNOSIS — R928 Other abnormal and inconclusive findings on diagnostic imaging of breast: Secondary | ICD-10-CM

## 2016-06-17 ENCOUNTER — Other Ambulatory Visit: Payer: Self-pay | Admitting: Internal Medicine

## 2016-06-17 ENCOUNTER — Ambulatory Visit
Admission: RE | Admit: 2016-06-17 | Discharge: 2016-06-17 | Disposition: A | Discharge status: Home / Self Care | Payer: BLUE CROSS/BLUE SHIELD | Source: Ambulatory Visit | Attending: Internal Medicine | Admitting: Internal Medicine

## 2016-06-17 DIAGNOSIS — R921 Mammographic calcification found on diagnostic imaging of breast: Secondary | ICD-10-CM

## 2016-06-17 DIAGNOSIS — R928 Other abnormal and inconclusive findings on diagnostic imaging of breast: Secondary | ICD-10-CM

## 2016-06-20 ENCOUNTER — Ambulatory Visit
Admission: RE | Admit: 2016-06-20 | Discharge: 2016-06-20 | Disposition: A | Discharge status: Home / Self Care | Payer: BLUE CROSS/BLUE SHIELD | Source: Ambulatory Visit | Attending: Internal Medicine | Admitting: Internal Medicine

## 2016-06-20 DIAGNOSIS — R921 Mammographic calcification found on diagnostic imaging of breast: Secondary | ICD-10-CM

## 2016-06-20 DIAGNOSIS — R928 Other abnormal and inconclusive findings on diagnostic imaging of breast: Secondary | ICD-10-CM

## 2016-06-20 HISTORY — PX: BREAST BIOPSY: SHX20

## 2016-06-21 ENCOUNTER — Ambulatory Visit
Admission: RE | Admit: 2016-06-21 | Discharge: 2016-06-21 | Disposition: A | Discharge status: Home / Self Care | Payer: BLUE CROSS/BLUE SHIELD | Source: Ambulatory Visit | Attending: Internal Medicine | Admitting: Internal Medicine

## 2016-06-21 DIAGNOSIS — R921 Mammographic calcification found on diagnostic imaging of breast: Secondary | ICD-10-CM

## 2016-07-15 ENCOUNTER — Other Ambulatory Visit: Payer: Self-pay | Admitting: General Surgery

## 2016-07-15 DIAGNOSIS — N6091 Unspecified benign mammary dysplasia of right breast: Secondary | ICD-10-CM

## 2016-07-21 ENCOUNTER — Other Ambulatory Visit: Payer: Self-pay | Admitting: General Surgery

## 2016-07-21 DIAGNOSIS — N6091 Unspecified benign mammary dysplasia of right breast: Secondary | ICD-10-CM

## 2016-08-03 ENCOUNTER — Encounter (HOSPITAL_BASED_OUTPATIENT_CLINIC_OR_DEPARTMENT_OTHER): Payer: Self-pay | Admitting: *Deleted

## 2016-08-10 ENCOUNTER — Ambulatory Visit
Admission: RE | Admit: 2016-08-10 | Discharge: 2016-08-10 | Disposition: A | Discharge status: Home / Self Care | Payer: BLUE CROSS/BLUE SHIELD | Source: Ambulatory Visit | Attending: General Surgery | Admitting: General Surgery

## 2016-08-10 DIAGNOSIS — N6091 Unspecified benign mammary dysplasia of right breast: Secondary | ICD-10-CM

## 2016-08-11 ENCOUNTER — Ambulatory Visit (HOSPITAL_BASED_OUTPATIENT_CLINIC_OR_DEPARTMENT_OTHER): Payer: BLUE CROSS/BLUE SHIELD | Admitting: Anesthesiology

## 2016-08-11 ENCOUNTER — Encounter (HOSPITAL_BASED_OUTPATIENT_CLINIC_OR_DEPARTMENT_OTHER): Payer: Self-pay | Admitting: *Deleted

## 2016-08-11 ENCOUNTER — Ambulatory Visit
Admission: RE | Admit: 2016-08-11 | Discharge: 2016-08-11 | Disposition: A | Discharge status: Home / Self Care | Payer: BLUE CROSS/BLUE SHIELD | Source: Ambulatory Visit | Attending: General Surgery | Admitting: General Surgery

## 2016-08-11 ENCOUNTER — Encounter (HOSPITAL_BASED_OUTPATIENT_CLINIC_OR_DEPARTMENT_OTHER)
Admission: RE | Disposition: A | Discharge status: Home / Self Care | Payer: Self-pay | Source: Ambulatory Visit | Attending: General Surgery

## 2016-08-11 ENCOUNTER — Ambulatory Visit (HOSPITAL_BASED_OUTPATIENT_CLINIC_OR_DEPARTMENT_OTHER)
Admission: RE | Admit: 2016-08-11 | Discharge: 2016-08-11 | Disposition: A | Discharge status: Home / Self Care | Payer: BLUE CROSS/BLUE SHIELD | Source: Ambulatory Visit | Attending: General Surgery | Admitting: General Surgery

## 2016-08-11 DIAGNOSIS — K219 Gastro-esophageal reflux disease without esophagitis: Secondary | ICD-10-CM | POA: Diagnosis not present

## 2016-08-11 DIAGNOSIS — F17200 Nicotine dependence, unspecified, uncomplicated: Secondary | ICD-10-CM | POA: Insufficient documentation

## 2016-08-11 DIAGNOSIS — N6091 Unspecified benign mammary dysplasia of right breast: Secondary | ICD-10-CM

## 2016-08-11 DIAGNOSIS — F419 Anxiety disorder, unspecified: Secondary | ICD-10-CM | POA: Diagnosis not present

## 2016-08-11 DIAGNOSIS — D0511 Intraductal carcinoma in situ of right breast: Secondary | ICD-10-CM | POA: Insufficient documentation

## 2016-08-11 HISTORY — PX: BREAST LUMPECTOMY: SHX2

## 2016-08-11 HISTORY — DX: Unspecified lump in the right breast, unspecified quadrant: N63.10

## 2016-08-11 HISTORY — PX: BREAST LUMPECTOMY WITH RADIOACTIVE SEED LOCALIZATION: SHX6424

## 2016-08-11 HISTORY — DX: Anxiety disorder, unspecified: F41.9

## 2016-08-11 SURGERY — BREAST LUMPECTOMY WITH RADIOACTIVE SEED LOCALIZATION
Anesthesia: General | Site: Breast | Laterality: Right

## 2016-08-11 MED ORDER — PROMETHAZINE HCL 25 MG/ML IJ SOLN: 6.2500 mg | INTRAMUSCULAR | Status: DC | PRN

## 2016-08-11 MED ORDER — GLYCOPYRROLATE 0.2 MG/ML IJ SOLN: 0.2000 mg | Freq: Once | INTRAMUSCULAR | Status: DC | PRN

## 2016-08-11 MED ORDER — LACTATED RINGERS IV SOLN: INTRAVENOUS | Status: DC

## 2016-08-11 MED ORDER — PROPOFOL 10 MG/ML IV BOLUS
INTRAVENOUS | Status: DC | PRN
  Administered 2016-08-11: 150 mg via INTRAVENOUS

## 2016-08-11 MED ORDER — HYDROCODONE-ACETAMINOPHEN 5-325 MG PO TABS: 1.0000 | ORAL_TABLET | ORAL | 0 refills | Status: DC | PRN

## 2016-08-11 MED ORDER — MIDAZOLAM HCL 2 MG/2ML IJ SOLN
INTRAMUSCULAR | Status: AC
  Filled 2016-08-11: qty 2

## 2016-08-11 MED ORDER — SCOPOLAMINE 1 MG/3DAYS TD PT72: 1.0000 | MEDICATED_PATCH | Freq: Once | TRANSDERMAL | Status: DC | PRN

## 2016-08-11 MED ORDER — ONDANSETRON HCL 4 MG/2ML IJ SOLN
INTRAMUSCULAR | Status: AC
  Filled 2016-08-11: qty 2

## 2016-08-11 MED ORDER — BUPIVACAINE-EPINEPHRINE (PF) 0.25% -1:200000 IJ SOLN
INTRAMUSCULAR | Status: DC | PRN
  Administered 2016-08-11: 20 mL

## 2016-08-11 MED ORDER — DEXAMETHASONE SODIUM PHOSPHATE 4 MG/ML IJ SOLN
INTRAMUSCULAR | Status: DC | PRN
  Administered 2016-08-11: 10 mg via INTRAVENOUS

## 2016-08-11 MED ORDER — OXYCODONE HCL 5 MG PO TABS
5.0000 mg | ORAL_TABLET | Freq: Once | ORAL | Status: AC | PRN
  Administered 2016-08-11: 5 mg via ORAL

## 2016-08-11 MED ORDER — OXYCODONE HCL 5 MG/5ML PO SOLN: 5.0000 mg | Freq: Once | ORAL | Status: AC | PRN

## 2016-08-11 MED ORDER — ONDANSETRON HCL 4 MG/2ML IJ SOLN
INTRAMUSCULAR | Status: DC | PRN
  Administered 2016-08-11: 4 mg via INTRAVENOUS

## 2016-08-11 MED ORDER — OXYCODONE HCL 5 MG PO TABS
ORAL_TABLET | ORAL | Status: AC
  Filled 2016-08-11: qty 1

## 2016-08-11 MED ORDER — HYDROMORPHONE HCL 1 MG/ML IJ SOLN
INTRAMUSCULAR | Status: AC
  Filled 2016-08-11: qty 1

## 2016-08-11 MED ORDER — CHLORHEXIDINE GLUCONATE CLOTH 2 % EX PADS: 6.0000 | MEDICATED_PAD | Freq: Once | CUTANEOUS | Status: DC

## 2016-08-11 MED ORDER — PROPOFOL 10 MG/ML IV BOLUS
INTRAVENOUS | Status: AC
  Filled 2016-08-11: qty 20

## 2016-08-11 MED ORDER — LACTATED RINGERS IV SOLN
INTRAVENOUS | Status: DC
  Administered 2016-08-11: 08:00:00 via INTRAVENOUS

## 2016-08-11 MED ORDER — DEXAMETHASONE SODIUM PHOSPHATE 10 MG/ML IJ SOLN
INTRAMUSCULAR | Status: AC
  Filled 2016-08-11: qty 1

## 2016-08-11 MED ORDER — MEPERIDINE HCL 25 MG/ML IJ SOLN: 6.2500 mg | INTRAMUSCULAR | Status: DC | PRN

## 2016-08-11 MED ORDER — HYDROMORPHONE HCL 1 MG/ML IJ SOLN
0.2500 mg | INTRAMUSCULAR | Status: DC | PRN
  Administered 2016-08-11: 0.5 mg via INTRAVENOUS

## 2016-08-11 MED ORDER — LIDOCAINE 2% (20 MG/ML) 5 ML SYRINGE
INTRAMUSCULAR | Status: AC
  Filled 2016-08-11: qty 5

## 2016-08-11 MED ORDER — LIDOCAINE HCL (CARDIAC) 20 MG/ML IV SOLN
INTRAVENOUS | Status: DC | PRN
  Administered 2016-08-11: 60 mg via INTRAVENOUS

## 2016-08-11 MED ORDER — FENTANYL CITRATE (PF) 100 MCG/2ML IJ SOLN
INTRAMUSCULAR | Status: AC
  Filled 2016-08-11: qty 2

## 2016-08-11 MED ORDER — CEFAZOLIN SODIUM-DEXTROSE 2-4 GM/100ML-% IV SOLN
2.0000 g | INTRAVENOUS | Status: AC
  Administered 2016-08-11: 2 g via INTRAVENOUS

## 2016-08-11 MED ORDER — CEFAZOLIN SODIUM-DEXTROSE 2-4 GM/100ML-% IV SOLN
INTRAVENOUS | Status: AC
  Filled 2016-08-11: qty 100

## 2016-08-11 MED ORDER — MIDAZOLAM HCL 2 MG/2ML IJ SOLN
1.0000 mg | INTRAMUSCULAR | Status: DC | PRN
  Administered 2016-08-11: 2 mg via INTRAVENOUS

## 2016-08-11 MED ORDER — SUCCINYLCHOLINE CHLORIDE 20 MG/ML IJ SOLN
INTRAMUSCULAR | Status: DC | PRN
  Administered 2016-08-11: 100 mg via INTRAVENOUS

## 2016-08-11 MED ORDER — FENTANYL CITRATE (PF) 100 MCG/2ML IJ SOLN
50.0000 ug | INTRAMUSCULAR | Status: DC | PRN
  Administered 2016-08-11 (×2): 50 ug via INTRAVENOUS

## 2016-08-11 MED ORDER — SUCCINYLCHOLINE CHLORIDE 200 MG/10ML IV SOSY
PREFILLED_SYRINGE | INTRAVENOUS | Status: AC
  Filled 2016-08-11: qty 10

## 2016-08-11 SURGICAL SUPPLY — 38 items
APPLIER CLIP 9.375 MED OPEN (MISCELLANEOUS) ×2
BLADE SURG 15 STRL LF DISP TIS (BLADE) ×1 IMPLANT
BLADE SURG 15 STRL SS (BLADE) ×1
CANISTER SUC SOCK COL 7IN (MISCELLANEOUS) IMPLANT
CANISTER SUCT 1200ML W/VALVE (MISCELLANEOUS) IMPLANT
CHLORAPREP W/TINT 26ML (MISCELLANEOUS) ×2 IMPLANT
CLIP APPLIE 9.375 MED OPEN (MISCELLANEOUS) ×1 IMPLANT
COVER BACK TABLE 60X90IN (DRAPES) ×2 IMPLANT
COVER MAYO STAND STRL (DRAPES) ×2 IMPLANT
COVER PROBE W GEL 5X96 (DRAPES) ×2 IMPLANT
DECANTER SPIKE VIAL GLASS SM (MISCELLANEOUS) IMPLANT
DEVICE DUBIN W/COMP PLATE 8390 (MISCELLANEOUS) ×2 IMPLANT
DRAPE LAPAROSCOPIC ABDOMINAL (DRAPES) ×2 IMPLANT
DRAPE UTILITY XL STRL (DRAPES) ×2 IMPLANT
ELECT COATED BLADE 2.86 ST (ELECTRODE) ×2 IMPLANT
ELECT REM PT RETURN 9FT ADLT (ELECTROSURGICAL) ×2
ELECTRODE REM PT RTRN 9FT ADLT (ELECTROSURGICAL) ×1 IMPLANT
GLOVE BIO SURGEON STRL SZ7.5 (GLOVE) ×4 IMPLANT
GOWN STRL REUS W/ TWL LRG LVL3 (GOWN DISPOSABLE) ×2 IMPLANT
GOWN STRL REUS W/TWL LRG LVL3 (GOWN DISPOSABLE) ×2
ILLUMINATOR WAVEGUIDE N/F (MISCELLANEOUS) ×2 IMPLANT
KIT MARKER MARGIN INK (KITS) ×2 IMPLANT
LIGHT WAVEGUIDE WIDE FLAT (MISCELLANEOUS) IMPLANT
LIQUID BAND (GAUZE/BANDAGES/DRESSINGS) ×2 IMPLANT
NEEDLE HYPO 25X1 1.5 SAFETY (NEEDLE) ×2 IMPLANT
NS IRRIG 1000ML POUR BTL (IV SOLUTION) ×2 IMPLANT
PACK BASIN DAY SURGERY FS (CUSTOM PROCEDURE TRAY) ×2 IMPLANT
PENCIL BUTTON HOLSTER BLD 10FT (ELECTRODE) ×2 IMPLANT
SLEEVE SCD COMPRESS KNEE MED (MISCELLANEOUS) ×2 IMPLANT
SPONGE LAP 18X18 X RAY DECT (DISPOSABLE) ×2 IMPLANT
SUT MON AB 4-0 PC3 18 (SUTURE) ×2 IMPLANT
SUT SILK 2 0 SH (SUTURE) IMPLANT
SUT VICRYL 3-0 CR8 SH (SUTURE) ×2 IMPLANT
SYR CONTROL 10ML LL (SYRINGE) ×2 IMPLANT
TOWEL OR 17X24 6PK STRL BLUE (TOWEL DISPOSABLE) ×2 IMPLANT
TOWEL OR NON WOVEN STRL DISP B (DISPOSABLE) ×2 IMPLANT
TUBE CONNECTING 20X1/4 (TUBING) ×2 IMPLANT
YANKAUER SUCT BULB TIP NO VENT (SUCTIONS) ×2 IMPLANT

## 2016-08-11 NOTE — Anesthesia Postprocedure Evaluation (Signed)
Anesthesia Post Note  Patient: Cassidy Mayer  Procedure(s) Performed: Procedure(s) (LRB): RIGHT BREAST LUMPECTOMY WITH RADIOACTIVE SEED LOCALIZATION (Right)  Patient location during evaluation: PACU Anesthesia Type: General Level of consciousness: awake and alert Pain management: pain level controlled Vital Signs Assessment: post-procedure vital signs reviewed and stable Respiratory status: spontaneous breathing, nonlabored ventilation, respiratory function stable and patient connected to nasal cannula oxygen Cardiovascular status: blood pressure returned to baseline and stable Postop Assessment: no signs of nausea or vomiting Anesthetic complications: no    Last Vitals:  Vitals:   08/11/16 1100 08/11/16 1115  BP: 115/72 (!) 84/66  Pulse: 78 76  Resp: 17 15  Temp:      Last Pain:  Vitals:   08/11/16 1115  TempSrc:   PainSc: 0-No pain                 Effie Berkshire

## 2016-08-11 NOTE — H&P (Signed)
Cassidy Mayer  Location: [facility] Patient #: [id number] DOB: Mar 15, 1966 Single / Language: Cassidy Mayer / Race: Black or African American Female   History of Present Illness The patient is a 50 year old female who presents with a breast mass. We are asked to see the patient in consultation by Dr. Lillia Mayer to evaluate her for an area of atypical duct hyperplasia in the upper outer right breast. The patient is a 51 year old black female who recently went for a routine screening mammogram. At that time she was found to have an abnormal cluster of calcification in the upper outer right breast posterior depth measuring about 1.5 cm. This was biopsied and came back as atypical duct hyperplasia. It has been about 6 years since her last mammogram. She denies any personal or family history of breast cancer. She denies any breast pain or discharge from the nipple.   Other Problems Anxiety Disorder Gastroesophageal Reflux Disease  Past Surgical History  Breast Biopsy Right. Colon Polyp Removal - Colonoscopy  Diagnostic Studies History  Colonoscopy 5-10 years ago Mammogram within last year Pap Smear 1-5 years ago  Allergies  Latex  Medication History  ALPRAZolam (0.5MG  Tablet, Oral) Active. Fluocinonide (0.1% Cream, External) Active. Clobetasol Propionate (0.05% Cream, External) Active. Vitamin D3 (2000UNIT Capsule, Oral) Active. Magnesium (500MG  Tablet, Oral) Active. Calcium "900" w/D (Oral) Active. Medications Reconciled  Social History  Alcohol use Occasional alcohol use. Caffeine use Coffee. No drug use Tobacco use Current every day smoker.  Family History  Thyroid problems Mother.  Pregnancy / Birth History Age at menarche 65 years. Gravida 5 Irregular periods Length (months) of breastfeeding 12-24 Maternal age 27-25 Para 3    Review of Systems  General Not Present- Appetite Loss, Chills, Fatigue, Fever, Night Sweats,  Weight Gain and Weight Loss. Skin Not Present- Change in Wart/Mole, Dryness, Hives, Jaundice, New Lesions, Non-Healing Wounds, Rash and Ulcer. HEENT Present- Wears glasses/contact lenses. Not Present- Earache, Hearing Loss, Hoarseness, Nose Bleed, Oral Ulcers, Ringing in the Ears, Seasonal Allergies, Sinus Pain, Sore Throat, Visual Disturbances and Yellow Eyes. Respiratory Not Present- Bloody sputum, Chronic Cough, Difficulty Breathing, Snoring and Wheezing. Cardiovascular Present- Palpitations and Rapid Heart Rate. Not Present- Chest Pain, Difficulty Breathing Lying Down, Leg Cramps, Shortness of Breath and Swelling of Extremities. Gastrointestinal Not Present- Abdominal Pain, Bloating, Bloody Stool, Change in Bowel Habits, Chronic diarrhea, Constipation, Difficulty Swallowing, Excessive gas, Gets full quickly at meals, Hemorrhoids, Indigestion, Nausea, Rectal Pain and Vomiting. Female Genitourinary Not Present- Frequency, Nocturia, Painful Urination, Pelvic Pain and Urgency. Musculoskeletal Not Present- Back Pain, Joint Pain, Joint Stiffness, Muscle Pain, Muscle Weakness and Swelling of Extremities. Neurological Not Present- Decreased Memory, Fainting, Headaches, Numbness, Seizures, Tingling, Tremor, Trouble walking and Weakness. Psychiatric Present- Anxiety. Not Present- Bipolar, Change in Sleep Pattern, Depression, Fearful and Frequent crying. Endocrine Present- Hot flashes. Not Present- Cold Intolerance, Excessive Hunger, Hair Changes, Heat Intolerance and New Diabetes. Hematology Not Present- Blood Thinners, Easy Bruising, Excessive bleeding, Gland problems, HIV and Persistent Infections.  Vitals  Weight: 129 lb Height: 64in Body Surface Area: 1.62 m Body Mass Index: 22.14 kg/m  Temp.: 97.69F(Temporal)  Pulse: 80 (Regular)  BP: 122/70 (Sitting, Left Arm, Standard)       Physical Exam  General Mental Status-Alert. General Appearance-Consistent with stated  age. Hydration-Well hydrated. Voice-Normal.  Head and Neck Head-normocephalic, atraumatic with no lesions or palpable masses. Trachea-midline. Thyroid Gland Characteristics - normal size and consistency.  Eye Eyeball - Bilateral-Extraocular movements intact. Sclera/Conjunctiva - Bilateral-No scleral icterus.  Chest and Lung Exam Chest and lung exam reveals -quiet, even and easy respiratory effort with no use of accessory muscles and on auscultation, normal breath sounds, no adventitious sounds and normal vocal resonance. Inspection Chest Wall - Normal. Back - normal.  Breast Note: There is no palpable mass in either breast. There is no palpable axillary, supra clavicular, or cervical lymphadenopathy.   Cardiovascular Cardiovascular examination reveals -normal heart sounds, regular rate and rhythm with no murmurs and normal pedal pulses bilaterally.  Abdomen Inspection Inspection of the abdomen reveals - No Hernias. Skin - Scar - no surgical scars. Palpation/Percussion Palpation and Percussion of the abdomen reveal - Soft, Non Tender, No Rebound tenderness, No Rigidity (guarding) and No hepatosplenomegaly. Auscultation Auscultation of the abdomen reveals - Bowel sounds normal.  Neurologic Neurologic evaluation reveals -alert and oriented x 3 with no impairment of recent or remote memory. Mental Status-Normal.  Musculoskeletal Normal Exam - Left-Upper Extremity Strength Normal and Lower Extremity Strength Normal. Normal Exam - Right-Upper Extremity Strength Normal and Lower Extremity Strength Normal.  Lymphatic Head & Neck  General Head & Neck Lymphatics: Bilateral - Description - Normal. Axillary  General Axillary Region: Bilateral - Description - Normal. Tenderness - Non Tender. Femoral & Inguinal  Generalized Femoral & Inguinal Lymphatics: Bilateral - Description - Normal. Tenderness - Non Tender.    Assessment and Plan: ATYPICAL  DUCTAL HYPERPLASIA OF RIGHT BREAST (N60.91) Impression: The patient has a small area of atypical duct hyperplasia in the upper outer right breast. Because this is considered a high risk lesion and can have an appearance similar to ductal carcinoma in situ I would recommend that this area be removed. She would also like to have this done. I have discussed with her in detail the risks and benefits of the operation to remove this area as well as some of the technical aspects and she understands and wishes to proceed. I will plan for a right breast radioactive seed localized lumpectomy. If her pathology is consistent with this then I will plan to also refer her to the cancer center to the high-risk clinic for risk reduction Current Plans Pt Education - Breast Diseases: discussed with patient and provided information.

## 2016-08-11 NOTE — Transfer of Care (Signed)
Immediate Anesthesia Transfer of Care Note  Patient: Cassidy Mayer  Procedure(s) Performed: Procedure(s): RIGHT BREAST LUMPECTOMY WITH RADIOACTIVE SEED LOCALIZATION (Right)  Patient Location: PACU  Anesthesia Type:General  Level of Consciousness: sedated and patient cooperative  Airway & Oxygen Therapy: Patient Spontanous Breathing and Patient connected to face mask oxygen  Post-op Assessment: Report given to RN and Post -op Vital signs reviewed and stable  Post vital signs: Reviewed and stable  Last Vitals:  Vitals:   08/11/16 0802  BP: 124/82  Pulse: 81  Resp: 20  Temp: 36.6 C    Last Pain:  Vitals:   08/11/16 0802  TempSrc: Oral         Complications: No apparent anesthesia complications

## 2016-08-11 NOTE — Anesthesia Procedure Notes (Addendum)
Procedure Name: Intubation Date/Time: 08/11/2016 9:52 AM Performed by: Lyndee Leo Pre-anesthesia Checklist: Patient identified, Emergency Drugs available, Suction available and Patient being monitored Patient Re-evaluated:Patient Re-evaluated prior to inductionOxygen Delivery Method: Circle system utilized Preoxygenation: Pre-oxygenation with 100% oxygen Intubation Type: IV induction Ventilation: Mask ventilation without difficulty Laryngoscope Size: Miller, 2, Mac and 4 Grade View: Grade II Tube type: Oral Tube size: 7.0 mm Number of attempts: 2 Airway Equipment and Method: Stylet and Oral airway Placement Confirmation: ETT inserted through vocal cords under direct vision,  positive ETCO2 and breath sounds checked- equal and bilateral Secured at: 21 cm Tube secured with: Tape Dental Injury: Teeth and Oropharynx as per pre-operative assessment

## 2016-08-11 NOTE — Op Note (Signed)
08/11/2016  10:33 AM  PATIENT:  Cassidy Mayer  50 y.o. female  PRE-OPERATIVE DIAGNOSIS:  RIGHT BREAST ADH  POST-OPERATIVE DIAGNOSIS:  RIGHT BREAST ADH  PROCEDURE:  Procedure(s): RIGHT BREAST LUMPECTOMY WITH RADIOACTIVE SEED LOCALIZATION (Right)  SURGEON:  Surgeon(s) and Role:    * Jovita Kussmaul, MD - Primary  PHYSICIAN ASSISTANT:   ASSISTANTS: none   ANESTHESIA:   local and general  EBL:  No intake/output data recorded.  BLOOD ADMINISTERED:none  DRAINS: none   LOCAL MEDICATIONS USED:  MARCAINE     SPECIMEN:  Source of Specimen:  right breast tissue with additional superior margin  DISPOSITION OF SPECIMEN:  PATHOLOGY  COUNTS:  YES  TOURNIQUET:  * No tourniquets in log *  DICTATION: .Dragon Dictation   After informed consent was obtained the patient was brought to the operating room and placed in the supine position on the operating room table. After adequate induction of general anesthesia the patient's right breast was prepped with ChloraPrep, allowed to dry, and draped in usual sterile manner. An appropriate timeout was performed. Previously an I-125 seed was placed in the upper outer quadrant of the right breast to mark an area of atypical ductal hyperplasia. The neoprobe was set to I-125 in the area of radioactivity was readily identified in the upper outer quadrant of the right breast. Next a small curvilinear incision was made along the outer edge of the breast in the upper outer quadrant near the axilla. This incision was carried through the skin and subcutaneous tissue sharply with electrocautery until the dissection reached the chest wall.  The dissection was then carried out between the breast tissue and the pectoralis muscle in the direction of the radioactive seed.  While checking the area of radioactivity frequently with the neoprobe a circular portion of breast tissue was excised sharply around the radioactive seed. During the dissection the seed was encountered  on the posterior surface of the lumpectomy in close proximity to the pectoralis muscle. Once the specimen was removed it was oriented with the appropriate paint colors. A specimen radiograph was obtained that showed the clip and seed to be within the specimen.  The clip and calcifications appeared to be close to the superior margin. An additional superior margin was taken sharply with the electrocautery and marked with the appropriate paint color. This was also sent to pathology for further evaluation. Hemostasis was achieved using the Bovie electrocautery. The wound was irrigated with saline and infiltrated with quarter percent Marcaine with epinephrine. The deep layer of the wound was then closed with layers of interrupted 3-0 Vicryl stitches. The skin was then closed with interrupted 4-0 Monocryl subcuticular stitches. Prior to closing the lumpectomy cavity was marked with clips. Dermabond dressings were then applied. The patient tolerated the procedure well. At the end of the case all needle sponge and instrument counts were correct. The patient was then awakened and taken to recovery in stable condition.  PLAN OF CARE: Discharge to home after PACU  PATIENT DISPOSITION:  PACU - hemodynamically stable.   Delay start of Pharmacological VTE agent (>24hrs) due to surgical blood loss or risk of bleeding: not applicable

## 2016-08-11 NOTE — Interval H&P Note (Signed)
History and Physical Interval Note:  08/11/2016 9:05 AM  Cassidy Mayer  has presented today for surgery, with the diagnosis of RIGHT BREAST ADH  The various methods of treatment have been discussed with the patient and family. After consideration of risks, benefits and other options for treatment, the patient has consented to  Procedure(s): RIGHT BREAST LUMPECTOMY WITH RADIOACTIVE SEED LOCALIZATION (Right) as a surgical intervention .  The patient's history has been reviewed, patient examined, no change in status, stable for surgery.  I have reviewed the patient's chart and labs.  Questions were answered to the patient's satisfaction.     TOTH III,Cashawn Yanko S

## 2016-08-11 NOTE — Discharge Instructions (Signed)

## 2016-08-11 NOTE — Anesthesia Preprocedure Evaluation (Addendum)
Anesthesia Evaluation  Patient identified by MRN, date of birth, ID band Patient awake    Reviewed: Allergy & Precautions, NPO status , Patient's Chart, lab work & pertinent test results  Airway Mallampati: I  TM Distance: >3 FB Neck ROM: Full    Dental  (+) Teeth Intact, Dental Advisory Given   Pulmonary Current Smoker,    breath sounds clear to auscultation       Cardiovascular negative cardio ROS   Rhythm:Regular Rate:Normal     Neuro/Psych PSYCHIATRIC DISORDERS Anxiety negative neurological ROS     GI/Hepatic negative GI ROS, Neg liver ROS,   Endo/Other  negative endocrine ROS  Renal/GU negative Renal ROS  negative genitourinary   Musculoskeletal negative musculoskeletal ROS (+)   Abdominal Normal abdominal exam  (+)   Peds negative pediatric ROS (+)  Hematology negative hematology ROS (+)   Anesthesia Other Findings   Reproductive/Obstetrics negative OB ROS                            Anesthesia Physical Anesthesia Plan  ASA: II  Anesthesia Plan: General   Post-op Pain Management:    Induction: Intravenous  Airway Management Planned: Oral ETT  Additional Equipment:   Intra-op Plan:   Post-operative Plan: Extubation in OR  Informed Consent: I have reviewed the patients History and Physical, chart, labs and discussed the procedure including the risks, benefits and alternatives for the proposed anesthesia with the patient or authorized representative who has indicated his/her understanding and acceptance.   Dental advisory given  Plan Discussed with: CRNA  Anesthesia Plan Comments: (ETT due to nausea. )       Anesthesia Quick Evaluation

## 2016-08-14 ENCOUNTER — Encounter (HOSPITAL_BASED_OUTPATIENT_CLINIC_OR_DEPARTMENT_OTHER): Payer: Self-pay | Admitting: General Surgery

## 2016-09-01 ENCOUNTER — Telehealth: Payer: Self-pay | Admitting: Hematology and Oncology

## 2016-09-01 ENCOUNTER — Encounter: Payer: Self-pay | Admitting: Hematology and Oncology

## 2016-09-01 NOTE — Telephone Encounter (Signed)
Pt confirmed appt that was scheduled for her w/[facility] on 10/12 @345pm . Demographics were verified. Letter mailed to the patient.

## 2016-09-01 NOTE — Progress Notes (Signed)
Location of Breast Cancer: Right Breast  Histology per Pathology Report:  06/25/16 Diagnosis Breast, right, needle core biopsy, upper outer quadrant - FOCAL ATYPICAL DUCTAL HYPERPLASIA. - MUCOCELE-LIKE LESION. - FIBROCYSTIC CHANGES WITH CALCIFICATIONS.  08/11/16 Diagnosis 1. 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 - ADMIXED FIBROCYSTIC CHANGES. - DUCTAL CARCINOMA IN SITU IS FOCALLY 0.2 TO 0.3 CM TO POSTERIOR MARGIN. - DUCTAL CARCINOMA IN SITU IS FOCALLY LESS THAN 0.1 CM TO SUPERIOR MARGIN. - OTHER MARGINS ARE NEGATIVE.   2. Breast, excision, Right additional superior margin - BENIGN FIBROFATTY SOFT TISSUE. - NO ATYPIA OR TUMOR SEEN.  Receptor Status: ER(100%), PR (70%),   Did patient present with symptoms or was this found on screening mammography?: It was found on a screening mammogram.   Past/Anticipated interventions by surgeon, if any: 08/11/16 PROCEDURE:  Procedure(s): RIGHT BREAST LUMPECTOMY WITH RADIOACTIVE SEED LOCALIZATION (Right) SURGEON:  Surgeon(s) and Role:    * Jovita Kussmaul, MD - Primary  Past/Anticipated interventions by medical oncology, if any:  She has an appointment with Dr. Lindi Adie 09/08/16   Lymphedema issues, if any:  She denies. She has good arm mobility.   Pain issues, if any:  She denies.   SAFETY ISSUES:  Prior radiation? No  Pacemaker/ICD? No  Possible current pregnancy? No  Is the patient on methotrexate? No  Current Complaints / other details:   BP 121/81   Pulse 83   Temp 98.4 F (36.9 C)   Ht 5\' 4"  (1.626 m)   Wt 128 lb 9.6 oz (58.3 kg)   LMP 04/06/2016 Comment: periods have been irreg  SpO2 100% Comment: room air  BMI 22.07 kg/m     Aleksi Brummet, Stephani Police, RN 09/01/2016,9:15 AM

## 2016-09-06 ENCOUNTER — Ambulatory Visit
Admission: RE | Admit: 2016-09-06 | Discharge: 2016-09-06 | Disposition: A | Discharge status: Home / Self Care | Payer: BLUE CROSS/BLUE SHIELD | Source: Ambulatory Visit | Attending: Radiation Oncology | Admitting: Radiation Oncology

## 2016-09-06 ENCOUNTER — Other Ambulatory Visit: Payer: Self-pay | Admitting: Radiation Oncology

## 2016-09-06 ENCOUNTER — Encounter: Payer: Self-pay | Admitting: Radiation Oncology

## 2016-09-06 VITALS — BP 121/81 | HR 83 | Temp 98.4°F | Ht 64.0 in | Wt 128.6 lb

## 2016-09-06 DIAGNOSIS — Z170 Estrogen receptor positive status [ER+]: Principal | ICD-10-CM

## 2016-09-06 DIAGNOSIS — C50411 Malignant neoplasm of upper-outer quadrant of right female breast: Secondary | ICD-10-CM | POA: Diagnosis present

## 2016-09-06 DIAGNOSIS — C50911 Malignant neoplasm of unspecified site of right female breast: Secondary | ICD-10-CM

## 2016-09-06 DIAGNOSIS — Z510 Encounter for antineoplastic radiation therapy: Secondary | ICD-10-CM | POA: Insufficient documentation

## 2016-09-06 NOTE — Progress Notes (Addendum)
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 female who presented with suspicious right breast calcifications on screening mammogram.  Biopsy showed focal atypical ductal hyperplasia, a mucocele-like lesion and fibrocystic changes with calcifications in the right breast.  She underwent right lumpectomy by Dr Marlou Starks on 08-11-16 revealing DCIS with characteristics as described above in the diagnosis. Margins are focally close superiorly by <0.1cm.  She works in Corporate treasurer, has two daughters, is a single mother. Her mother is present today.   Recently started Chantix with a smoking quit date this week.  She denies prior cancer, denies family history of breast or ovarian cancer, and denies prior RT. No lymphedema.  PREVIOUS RADIATION THERAPY: No  PAST MEDICAL HISTORY:  has a past medical history of Anxiety; Breast mass, right; and Eczema.    PAST SURGICAL HISTORY: Past Surgical History:  Procedure Laterality Date  . BREAST LUMPECTOMY WITH RADIOACTIVE SEED LOCALIZATION Right 08/11/2016   Procedure: RIGHT BREAST LUMPECTOMY WITH RADIOACTIVE SEED LOCALIZATION;  Surgeon: Autumn Messing III, MD;  Location: [facility];  Service: General;  Laterality: Right;  . ELBOW SURGERY    . TUBAL LIGATION      FAMILY HISTORY: family history is not on file.  SOCIAL HISTORY:  reports that she has been smoking Cigars.  She has been smoking about 0.25 packs per day. She has never used smokeless tobacco. She reports that she does not drink alcohol or use drugs.  ALLERGIES: Latex  MEDICATIONS:  Current Outpatient  Prescriptions  Medication Sig Dispense Refill  . Calcium Carb-Cholecalciferol (CALCIUM-VITAMIN D) 600-400 MG-UNIT TABS Take by mouth.    . Cholecalciferol (VITAMIN D3) 2000 units TABS Take by mouth.    . CLOBETASOL PROPIONATE EX Apply topically.    Marland Kitchen esomeprazole (NEXIUM) 40 MG capsule Take 40 mg by mouth daily at 12 noon.    . magnesium 30 MG tablet Take 30 mg by mouth 2 (two) times daily.    Marland Kitchen UNABLE TO FIND Med Name: Dermasmooth, spray for hair. Used every other night    . CHANTIX STARTING MONTH PAK 0.5 MG X 11 & 1 MG X 42 tablet TK UTD  0   No current facility-administered medications for this encounter.     REVIEW OF SYSTEMS:  Notable for that above.   PHYSICAL EXAM:  height is 5' 4" (1.626 m) and weight is 128 lb 9.6 oz (58.3 kg). Her temperature is 98.4 F (36.9 C). Her blood pressure is 121/81 and her pulse is 83. Her oxygen saturation is 100%.   General: Alert and oriented, in no acute distress HEENT: Head is normocephalic. Extraocular movements are intact. Oropharynx is clear. Neck: Neck is supple, no palpable cervical or supraclavicular lymphadenopathy. Heart: Regular in rate and rhythm with no murmurs, rubs, or gallops. Chest: Clear to auscultation bilaterally, with no rhonchi, wheezes, or rales. Abdomen: Soft, nontender, nondistended, with no rigidity or guarding. Extremities: No cyanosis or edema. Lymphatics: see Neck Exam Skin: No concerning lesions. Musculoskeletal: symmetric strength and muscle tone throughout. Neurologic: Cranial nerves II through XII are grossly intact. No obvious focalities. Speech is fluent. Coordination  is intact. Psychiatric: Judgment and insight are intact. Affect is appropriate. Breasts: well healed right UOQ lumpectomy scar. No palpable masses appreciated in the breasts or axillae bilaterally    ECOG = 0  0 - Asymptomatic (Fully active, able to carry on all predisease activities without restriction)  1 - Symptomatic but completely  ambulatory (Restricted in physically strenuous activity but ambulatory and able to carry out work of a light or sedentary nature. For example, light housework, office work)  2 - Symptomatic, <50% in bed during the day (Ambulatory and capable of all self care but unable to carry out any work activities. Up and about more than 50% of waking hours)  3 - Symptomatic, >50% in bed, but not bedbound (Capable of only limited self-care, confined to bed or chair 50% or more of waking hours)  4 - Bedbound (Completely disabled. Cannot carry on any self-care. Totally confined to bed or chair)  5 - Death   Eustace Pen MM, Creech RH, Tormey DC, et al. [phone]). "Toxicity and response criteria of the [facility]". [place] Oncol. 5 (6): 649-55   LABORATORY DATA:  Lab Results  Component Value Date   WBC 4.8 01/07/2016   HGB 12.9 01/07/2016   HCT 38.9 01/07/2016   MCV 83.5 01/07/2016   PLT 163 01/07/2016   CMP     Component Value Date/Time   NA 139 01/07/2016 0740   K 3.8 01/07/2016 0740   CL 106 01/07/2016 0740   CO2 22 01/07/2016 0740   GLUCOSE 93 01/07/2016 0740   BUN <5 (L) 01/07/2016 0740   CREATININE 0.64 01/07/2016 0740   CALCIUM 9.5 01/07/2016 0740   PROT 5.7 (L) 10/09/2007 1132   ALBUMIN 2.3 (L) 10/09/2007 1132   AST 22 10/09/2007 1132   ALT 18 10/09/2007 1132   ALKPHOS 85 10/09/2007 1132   BILITOT 0.4 10/09/2007 1132   GFRNONAA >60 01/07/2016 0740   GFRAA >60 01/07/2016 0740         RADIOGRAPHY: Mm Breast Surgical Specimen  Result Date: 08/11/2016 CLINICAL DATA:  Evaluate surgical specimen following right breast excision for atypical ductal hyperplasia and mucocele like lesion. EXAM: SPECIMEN RADIOGRAPH OF THE RIGHT BREAST COMPARISON:  Previous exam(s). FINDINGS: Status post excision of the right breast. The radioactive seed and biopsy marker clip are present, completely intact, and were marked for pathology. The calcifications are along the margin of the  specimen which was relayed to the operating room. IMPRESSION: Specimen radiograph of the right breast. Electronically Signed   By: Margarette Canada M.D.   On: 08/11/2016 10:24   Mm Rt Radioactive Seed Loc Mammo Guide  Result Date: 08/10/2016 CLINICAL DATA:  Patient with atypical ductal hyperplasia of the right breast. Patient is scheduled for surgical excision requiring preoperative radioactive seed localization. EXAM: MAMMOGRAPHIC GUIDED RADIOACTIVE SEED LOCALIZATION OF THE RIGHT BREAST COMPARISON:  Previous exam(s). FINDINGS: Patient presents for radioactive seed localization prior to surgical excision. I met with the patient and we discussed the procedure of seed localization including benefits and alternatives. We discussed the high likelihood of a successful procedure. We discussed the risks of the procedure including infection, bleeding, tissue injury and further surgery. We discussed the low dose of radioactivity involved in the procedure. Informed, written consent was given. The usual time-out protocol was performed immediately prior to the procedure. Using mammographic guidance, sterile technique, 1% lidocaine and an I-125 radioactive seed, the X shaped clip within the outer right breast was localized using a lateral approach.  The follow-up mammogram images confirm the seed in the expected location and were marked for Dr. Marlou Starks. Follow-up survey of the patient confirms presence of the radioactive seed. Order number of I-125 seed:  [id number]. Total activity:  1.607 millicuries  Reference Date: 07/19/2016 The patient tolerated the procedure well and was released from the [facility]. She was given instructions regarding seed removal. IMPRESSION: Radioactive seed localization right breast. No apparent complications. Electronically Signed   By: Franki Cabot M.D.   On: 08/10/2016 13:25      IMPRESSION/PLAN: Right Breast DCIS, ER+   It was a pleasure meeting the patient today. We discussed the risks,  benefits, and side effects of radiotherapy. I recommend radiotherapy to the right breast to reduce her risk of locoregional recurrence by 1/2.  Risk of locoregional recurrence is 20% over 10 yrs without RT and 8% over 10 yrs with RT, per the [facility] nomogram.  We discussed that radiation would take approximately 4 weeks to complete. We spoke about acute effects including skin irritation and fatigue as well as much less common late effects including internal organ injury or irritation. We spoke about the latest technology that is used to minimize the risk of late effects for patients undergoing radiotherapy to the breast or chest wall. No guarantees of treatment were given. The patient is enthusiastic about proceeding with treatment. I look forward to participating in the patient's care.  Refer to social work for economic/social stress and financial counseling.  The patient continues to use tobacco. The patient was advised to stop using tobacco and was offered  further counseling to help with this. The patient declined further counseling at this time but plans to quit on Chantix this week.  I spent 55 minutes  face to face with the patient and more than 50% of that time was spent in counseling and/or coordination of care.   __________________________________________   Eppie Gibson, MD

## 2016-09-07 ENCOUNTER — Telehealth: Payer: Self-pay | Admitting: *Deleted

## 2016-09-07 NOTE — Telephone Encounter (Signed)
CALLED PATIENT TO INFORM OF APPT. FOR MAMMOGRAM ON 09-12-16 - ARRIVAL TIME - 8:20 AM @ [facility] AND HER SIM ON 09-13-16 @ 9 AM WITH DR. Isidore Moos, LVM FOR A RETURN CALL

## 2016-09-08 ENCOUNTER — Ambulatory Visit (HOSPITAL_BASED_OUTPATIENT_CLINIC_OR_DEPARTMENT_OTHER): Payer: BLUE CROSS/BLUE SHIELD | Admitting: Hematology and Oncology

## 2016-09-08 ENCOUNTER — Encounter: Payer: Self-pay | Admitting: Hematology and Oncology

## 2016-09-08 ENCOUNTER — Other Ambulatory Visit: Payer: BLUE CROSS/BLUE SHIELD

## 2016-09-08 DIAGNOSIS — Z170 Estrogen receptor positive status [ER+]: Secondary | ICD-10-CM

## 2016-09-08 DIAGNOSIS — D0511 Intraductal carcinoma in situ of right breast: Secondary | ICD-10-CM

## 2016-09-08 DIAGNOSIS — C50411 Malignant neoplasm of upper-outer quadrant of right female breast: Secondary | ICD-10-CM

## 2016-09-08 MED ORDER — TAMOXIFEN CITRATE 20 MG PO TABS: 20.0000 mg | ORAL_TABLET | Freq: Every day | ORAL | 3 refills | Status: DC

## 2016-09-08 NOTE — Progress Notes (Signed)
[facility] CONSULT NOTE  Patient Care Team: Jilda Panda, MD as PCP - General (Internal Medicine)  CHIEF COMPLAINTS/PURPOSE OF CONSULTATION:  Newly diagnosed breast cancer  HISTORY OF PRESENTING ILLNESS:  Cassidy Mayer 50 y.o. female is here because of recent diagnosis of right breast DCIS. Patient had a routine screening mammogram the detected abnormality in the right breast. She underwent a biopsy which came back as focal atypical ductal hyperplasia with fibrocystic changes. She underwent lumpectomy in 1914 2017 which revealed low-grade DCIS with calcifications. All margins were negative. Eyes was 0.5 cm and it was ER/PR positive. She was seen by radiation oncology yesterday and she is scheduled to undergo radiation. She is here today to discuss medical oncology recommendations. Patient works as a Psychologist, sport and exercise at [employer].  I reviewed her records extensively and collaborated the history with the patient.  SUMMARY OF ONCOLOGIC HISTORY:   Carcinoma of upper-outer quadrant of right breast in female, estrogen receptor positive ([place])   06/20/2016 Initial Diagnosis    Right breast biopsy: Focal atypical ductal hyperplasia with fibrocystic changes      08/11/2016 Surgery    Right lumpectomy: Low-grade DCIS with calcifications, all margins are negative,size 0.5 cm,ER 100%, PR 70%, Tis N0 stage 0      MEDICAL HISTORY:  Past Medical History:  Diagnosis Date  . Anxiety   . Breast mass, right   . Eczema     SURGICAL HISTORY: Past Surgical History:  Procedure Laterality Date  . BREAST LUMPECTOMY WITH RADIOACTIVE SEED LOCALIZATION Right 08/11/2016   Procedure: RIGHT BREAST LUMPECTOMY WITH RADIOACTIVE SEED LOCALIZATION;  Surgeon: Autumn Messing III, MD;  Location: [facility];  Service: General;  Laterality: Right;  . ELBOW SURGERY    . TUBAL LIGATION      SOCIAL HISTORY: Social History   Social History  . Marital status:  Single    Spouse name: N/A  . Number of children: N/A  . Years of education: N/A   Occupational History  . Not on file.   Social History Main Topics  . Smoking status: Current Every Day Smoker    Packs/day: 0.25    Types: Cigars  . Smokeless tobacco: Never Used     Comment: smokes black and milds  . Alcohol use No  . Drug use: No  . Sexual activity: Not Currently    Birth control/ protection: Surgical   Other Topics Concern  . Not on file   Social History Narrative  . No narrative on file    FAMILY HISTORY: No family history on file.  ALLERGIES:  is allergic to latex.  MEDICATIONS:  Current Outpatient Prescriptions  Medication Sig Dispense Refill  . Calcium Carb-Cholecalciferol (CALCIUM-VITAMIN D) 600-400 MG-UNIT TABS Take by mouth.    . CHANTIX STARTING MONTH PAK 0.5 MG X 11 & 1 MG X 42 tablet TK UTD  0  . Cholecalciferol (VITAMIN D3) 2000 units TABS Take by mouth.    . CLOBETASOL PROPIONATE EX Apply topically.    Marland Kitchen esomeprazole (NEXIUM) 40 MG capsule Take 40 mg by mouth daily at 12 noon.    . magnesium 30 MG tablet Take 30 mg by mouth 2 (two) times daily.    Marland Kitchen UNABLE TO FIND Med Name: Dermasmooth, spray for hair. Used every other night     No current facility-administered medications for this visit.     REVIEW OF SYSTEMS:   Constitutional: Denies fevers, chills or abnormal night sweats Eyes: Denies  blurriness of vision, double vision or watery eyes Ears, nose, mouth, throat, and face: Denies mucositis or sore throat Respiratory: Denies cough, dyspnea or wheezes Cardiovascular: Denies palpitation, chest discomfort or lower extremity swelling Gastrointestinal:  Denies nausea, heartburn or change in bowel habits Skin: Denies abnormal skin rashes Lymphatics: Denies new lymphadenopathy or easy bruising Neurological:Denies numbness, tingling or new weaknesses Behavioral/Psych: Mood is stable, no new changes  Breast: recent lumpectomy All other systems were  reviewed with the patient and are negative.  PHYSICAL EXAMINATION: ECOG PERFORMANCE STATUS: 1 - Symptomatic but completely ambulatory  Vitals:   09/08/16 1540  BP: 115/78  Pulse: 75  Resp: 18  Temp: 97.7 F (36.5 C)   Filed Weights   09/08/16 1540  Weight: 131 lb (59.4 kg)    GENERAL:alert, no distress and comfortable SKIN: skin color, texture, turgor are normal, no rashes or significant lesions EYES: normal, conjunctiva are pink and non-injected, sclera clear OROPHARYNX:no exudate, no erythema and lips, buccal mucosa, and tongue normal  NECK: supple, thyroid normal size, non-tender, without nodularity LYMPH:  no palpable lymphadenopathy in the cervical, axillary or inguinal LUNGS: clear to auscultation and percussion with normal breathing effort HEART: regular rate & rhythm and no murmurs and no lower extremity edema ABDOMEN:abdomen soft, non-tender and normal bowel sounds Musculoskeletal:no cyanosis of digits and no clubbing  PSYCH: alert & oriented x 3 with fluent speech NEURO: no focal motor/sensory deficits BREAST: No palpable nodules in breast. No palpable axillary or supraclavicular lymphadenopathy (exam performed in the presence of a chaperone)   LABORATORY DATA:  I have reviewed the data as listed Lab Results  Component Value Date   WBC 4.8 01/07/2016   HGB 12.9 01/07/2016   HCT 38.9 01/07/2016   MCV 83.5 01/07/2016   PLT 163 01/07/2016   Lab Results  Component Value Date   NA 139 01/07/2016   K 3.8 01/07/2016   CL 106 01/07/2016   CO2 22 01/07/2016    RADIOGRAPHIC STUDIES: I have personally reviewed the radiological reports and agreed with the findings in the report.  ASSESSMENT AND PLAN:  Carcinoma of upper-outer quadrant of right breast in female, estrogen receptor positive ([place]) Right lumpectomy 06/20/2016: Low-grade DCIS with calcifications, all margins are negative,size 0.5 cm,ER 100%, PR 70%, Tis N0 stage 0  Pathology review: I discussed with  the patient the difference between DCIS and invasive breast cancer. It is considered a precancerous lesion. DCIS is classified as a 0. It is generally detected through mammograms as calcifications. We discussed the significance of grades and its impact on prognosis. We also discussed the importance of ER and PR receptors and their implications to adjuvant treatment options. Prognosis of DCIS dependence on grade, comedo necrosis. It is anticipated that if not treated, 20-30% of DCIS can develop into invasive breast cancer.  Recommendation: 1. Adjuvant radiation therapy 2. Followed by antiestrogen therapy with tamoxifen 5 years  Tamoxifen counseling: We discussed the risks and benefits of tamoxifen. These include but not limited to insomnia, hot flashes, mood changes, vaginal dryness, and weight gain. Although rare, serious side effects including endometrial cancer, risk of blood clots were also discussed. We strongly believe that the benefits far outweigh the risks. Patient understands these risks and consented to starting treatment. Planned treatment duration is 5 years.  Return to clinic after radiation therapy in 3 months. She will start antiestrogen therapy after completion of radiation therapy in 2 months. I sent a prescription for tamoxifen.   All questions were answered. The  patient knows to call the clinic with any problems, questions or concerns.    Rulon Eisenmenger, MD 09/08/16

## 2016-09-08 NOTE — Assessment & Plan Note (Signed)
Right lumpectomy 06/20/2016: Low-grade DCIS with calcifications, all margins are negative,size 0.5 cm,ER 100%, PR 70%, Tis N0 stage 0  Pathology review: I discussed with the patient the difference between DCIS and invasive breast cancer. It is considered a precancerous lesion. DCIS is classified as a 0. It is generally detected through mammograms as calcifications. We discussed the significance of grades and its impact on prognosis. We also discussed the importance of ER and PR receptors and their implications to adjuvant treatment options. Prognosis of DCIS dependence on grade, comedo necrosis. It is anticipated that if not treated, 20-30% of DCIS can develop into invasive breast cancer.  Recommendation: 1. Adjuvant radiation therapy 2. Followed by antiestrogen therapy with tamoxifen 5 years  Tamoxifen counseling: We discussed the risks and benefits of tamoxifen. These include but not limited to insomnia, hot flashes, mood changes, vaginal dryness, and weight gain. Although rare, serious side effects including endometrial cancer, risk of blood clots were also discussed. We strongly believe that the benefits far outweigh the risks. Patient understands these risks and consented to starting treatment. Planned treatment duration is 5 years.  Return to clinic after radiation therapy in 3 months.

## 2016-09-12 ENCOUNTER — Telehealth: Payer: Self-pay | Admitting: *Deleted

## 2016-09-12 ENCOUNTER — Ambulatory Visit
Admission: RE | Admit: 2016-09-12 | Discharge: 2016-09-12 | Disposition: A | Discharge status: Home / Self Care | Payer: BLUE CROSS/BLUE SHIELD | Source: Ambulatory Visit | Attending: Radiation Oncology | Admitting: Radiation Oncology

## 2016-09-12 DIAGNOSIS — C50411 Malignant neoplasm of upper-outer quadrant of right female breast: Secondary | ICD-10-CM

## 2016-09-12 DIAGNOSIS — Z170 Estrogen receptor positive status [ER+]: Principal | ICD-10-CM

## 2016-09-12 NOTE — Progress Notes (Signed)
  Radiation Oncology         ([phone]) [phone] ________________________________  Name: Cassidy Mayer MRN: [id number]  Date: 09/13/2016  DOB: [birth year]  SIMULATION AND TREATMENT PLANNING NOTE    Outpatient  DIAGNOSIS:     ICD-9-CM ICD-10-CM   1. Carcinoma of upper-outer quadrant of right breast in female, estrogen receptor positive ([place]) 174.4 C50.411    V86.0 Z17.0     NARRATIVE:  The patient was brought to the [facility].  Identity was confirmed.  All relevant records and images related to the planned course of therapy were reviewed.  The patient freely provided informed written consent to proceed with treatment after reviewing the details related to the planned course of therapy. The consent form was witnessed and verified by the simulation staff.    Then, the patient was set-up in a stable reproducible supine position for radiation therapy with her ipsilateral arm over her head, and her upper body secured in a custom-made Vac-lok device.  CT images were obtained.  Surface markings were placed.  The CT images were loaded into the planning software.    TREATMENT PLANNING NOTE: Treatment planning then occurred.  The radiation prescription was entered and confirmed.     A total of 3 medically necessary complex treatment devices were fabricated and supervised by me: 2 fields with MLCs for custom blocks to protect heart, and lungs;  and, a Vac-lok. MORE COMPLEX DEVICES MAY BE MADE IN DOSIMETRY FOR FIELD IN FIELD BEAMS FOR DOSE HOMOGENEITY.  I have requested : 3D Simulation  I have requested a DVH of the following structures: lungs, heart, lumpectomy cavity.    The patient will receive 40.05 Gy in 15 fractions to the right with 2 tangential fields.   This will be followed by a boost.  Optical Surface Tracking Plan:  Since intensity modulated radiotherapy (IMRT) and 3D conformal radiation treatment methods are predicated on accurate and precise positioning for treatment,  intrafraction motion monitoring is medically necessary to ensure accurate and safe treatment delivery. The ability to quantify intrafraction motion without excessive ionizing radiation dose can only be performed with optical surface tracking. Accordingly, surface imaging offers the opportunity to obtain 3D measurements of patient position throughout IMRT and 3D treatments without excessive radiation exposure. I am ordering optical surface tracking for this patient's upcoming course of radiotherapy.  ________________________________   Reference:  Ursula Alert, J, et al. Surface imaging-based analysis of intrafraction motion for breast radiotherapy patients.Journal of [place], n. 6, nov. 2014. ISSN [id number].  Available at: <http://www.jacmp.org/index.php/jacmp/article/view/4957>.    -----------------------------------  Eppie Gibson, MD

## 2016-09-12 NOTE — Telephone Encounter (Signed)
Gave pt navigation resources. Discussed SIM. Gave emotion support and encouragement. Denies further questions or needs at this time. Encourage pt to call with concerns. Received verbal understanding.

## 2016-09-12 NOTE — Telephone Encounter (Signed)
Left vm to give navigation resources. Contact information provided.

## 2016-09-13 ENCOUNTER — Ambulatory Visit
Admission: RE | Admit: 2016-09-13 | Discharge: 2016-09-13 | Disposition: A | Discharge status: Home / Self Care | Payer: BLUE CROSS/BLUE SHIELD | Source: Ambulatory Visit | Attending: Radiation Oncology | Admitting: Radiation Oncology

## 2016-09-13 ENCOUNTER — Encounter: Payer: Self-pay | Admitting: Radiation Oncology

## 2016-09-13 DIAGNOSIS — Z170 Estrogen receptor positive status [ER+]: Principal | ICD-10-CM

## 2016-09-13 DIAGNOSIS — Z510 Encounter for antineoplastic radiation therapy: Secondary | ICD-10-CM | POA: Diagnosis not present

## 2016-09-13 DIAGNOSIS — C50411 Malignant neoplasm of upper-outer quadrant of right female breast: Secondary | ICD-10-CM

## 2016-09-13 NOTE — Progress Notes (Signed)
Paperwork (FMLA) received, given to nurse

## 2016-09-14 ENCOUNTER — Encounter: Payer: Self-pay | Admitting: Radiation Oncology

## 2016-09-14 NOTE — Progress Notes (Signed)
Spoke with patient on 10/17--she will bring in her income verification next week--will evaluate for 1,000.00 [place]

## 2016-09-16 ENCOUNTER — Encounter: Payer: Self-pay | Admitting: *Deleted

## 2016-09-16 NOTE — Progress Notes (Signed)
[place] Work  Clinical Social Work was referred by Firefighter for assessment of psychosocial needs; primarily economic stressors.  Clinical Social Worker attempted to contact patient by phone to offer support and assess for needs.  Left voicemail to return call when convenient.   Polo Riley, MSW, LCSW, OSW-C Clinical Social Worker [facility] [phone]

## 2016-09-19 DIAGNOSIS — Z510 Encounter for antineoplastic radiation therapy: Secondary | ICD-10-CM | POA: Diagnosis not present

## 2016-09-20 ENCOUNTER — Ambulatory Visit
Admission: RE | Admit: 2016-09-20 | Discharge: 2016-09-20 | Disposition: A | Discharge status: Home / Self Care | Payer: BLUE CROSS/BLUE SHIELD | Source: Ambulatory Visit | Attending: Radiation Oncology | Admitting: Radiation Oncology

## 2016-09-20 DIAGNOSIS — Z510 Encounter for antineoplastic radiation therapy: Secondary | ICD-10-CM | POA: Diagnosis not present

## 2016-09-21 ENCOUNTER — Ambulatory Visit
Admission: RE | Admit: 2016-09-21 | Discharge: 2016-09-21 | Disposition: A | Discharge status: Home / Self Care | Payer: BLUE CROSS/BLUE SHIELD | Source: Ambulatory Visit | Attending: Radiation Oncology | Admitting: Radiation Oncology

## 2016-09-21 DIAGNOSIS — Z510 Encounter for antineoplastic radiation therapy: Secondary | ICD-10-CM | POA: Diagnosis not present

## 2016-09-22 ENCOUNTER — Ambulatory Visit
Admission: RE | Admit: 2016-09-22 | Discharge: 2016-09-22 | Disposition: A | Discharge status: Home / Self Care | Payer: BLUE CROSS/BLUE SHIELD | Source: Ambulatory Visit | Attending: Radiation Oncology | Admitting: Radiation Oncology

## 2016-09-22 DIAGNOSIS — Z510 Encounter for antineoplastic radiation therapy: Secondary | ICD-10-CM | POA: Diagnosis not present

## 2016-09-23 ENCOUNTER — Ambulatory Visit
Admission: RE | Admit: 2016-09-23 | Discharge: 2016-09-23 | Disposition: A | Discharge status: Home / Self Care | Payer: BLUE CROSS/BLUE SHIELD | Source: Ambulatory Visit | Attending: Radiation Oncology | Admitting: Radiation Oncology

## 2016-09-23 DIAGNOSIS — Z510 Encounter for antineoplastic radiation therapy: Secondary | ICD-10-CM | POA: Diagnosis not present

## 2016-09-26 ENCOUNTER — Ambulatory Visit
Admission: RE | Admit: 2016-09-26 | Discharge: 2016-09-26 | Disposition: A | Discharge status: Home / Self Care | Payer: BLUE CROSS/BLUE SHIELD | Source: Ambulatory Visit | Attending: Radiation Oncology | Admitting: Radiation Oncology

## 2016-09-26 ENCOUNTER — Encounter: Payer: Self-pay | Admitting: Radiation Oncology

## 2016-09-26 ENCOUNTER — Ambulatory Visit: Admission: RE | Admit: 2016-09-26 | Payer: BLUE CROSS/BLUE SHIELD | Source: Ambulatory Visit

## 2016-09-26 VITALS — BP 122/79 | HR 75 | Temp 98.2°F | Resp 18 | Ht 64.0 in | Wt 133.8 lb

## 2016-09-26 DIAGNOSIS — Z510 Encounter for antineoplastic radiation therapy: Secondary | ICD-10-CM | POA: Diagnosis not present

## 2016-09-26 DIAGNOSIS — Z170 Estrogen receptor positive status [ER+]: Secondary | ICD-10-CM

## 2016-09-26 DIAGNOSIS — C50411 Malignant neoplasm of upper-outer quadrant of right female breast: Secondary | ICD-10-CM | POA: Diagnosis not present

## 2016-09-26 MED ORDER — ALRA NON-METALLIC DEODORANT (RAD-ONC)
1.0000 "application " | Freq: Once | TOPICAL | Status: AC
  Administered 2016-09-26: 1 via TOPICAL

## 2016-09-26 MED ORDER — RADIAPLEXRX EX GEL
Freq: Once | CUTANEOUS | Status: AC
  Administered 2016-09-26: 14:00:00 via TOPICAL

## 2016-09-26 NOTE — Addendum Note (Signed)
Encounter addended by: Malena Edman, RN on: 09/26/2016  2:52 PM<BR>    Actions taken: Patient Education assessment filed

## 2016-09-26 NOTE — Progress Notes (Signed)
Paperwork completed given back to patient 10/26, no further assistance needed

## 2016-09-26 NOTE — Progress Notes (Signed)
   Weekly Management Note:  outpatient    ICD-9-CM ICD-10-CM   1. Carcinoma of upper-outer quadrant of right breast in female, estrogen receptor positive ([facility]) 174.4 C50.411 hyaluronate sodium (RADIAPLEXRX) gel   [id number] non-metallic deodorant (ALRA) 1 application    Current Dose:  10.68 Gy  Projected Dose: 50.05 Gy   Narrative:  The patient presents for routine under treatment assessment.  CBCT/MVCT images/Port film x-rays were reviewed.  The chart was checked. Tired.   Physical Findings:  height is 5\' 4"  (1.626 m) and weight is 133 lb 12.8 oz (60.7 kg). Her oral temperature is 98.2 F (36.8 C). Her blood pressure is 122/79 and her pulse is 75. Her respiration is 18 and oxygen saturation is 100%.   Wt Readings from Last 3 Encounters:  09/26/16 133 lb 12.8 oz (60.7 kg)  09/08/16 131 lb (59.4 kg)  09/06/16 128 lb 9.6 oz (58.3 kg)   No skin irritation thus far over right breast.  Impression:  The patient is tolerating radiotherapy.  Plan:  Continue radiotherapy as planned. Patient instructed to apply Radiplex to intact skin in treatment fields.      ________________________________   Eppie Gibson, M.D.

## 2016-09-26 NOTE — Progress Notes (Signed)
Weight and vital signs are stable.  Cassidy Mayer has received 4 radiation treatments to her right breast.  Skin to right breast with normal color and intact, with tenderness.  Has eczema under right armpit that causes itching.  Appetite is good.  Denies pain.  Patient teaching done today given Radiaplex gel and Alra deodorant with instructions, see documentation under education area. Wt Readings from Last 3 Encounters:  09/26/16 133 lb 12.8 oz (60.7 kg)  09/08/16 131 lb (59.4 kg)  09/06/16 128 lb 9.6 oz (58.3 kg)  BP 122/79   Pulse 75   Temp 98.2 F (36.8 C) (Oral)   Resp 18   Ht 5\' 4"  (1.626 m)   Wt 133 lb 12.8 oz (60.7 kg)   LMP 03/28/2016   SpO2 100%   BMI 22.97 kg/m

## 2016-09-27 ENCOUNTER — Ambulatory Visit
Admission: RE | Admit: 2016-09-27 | Discharge: 2016-09-27 | Disposition: A | Discharge status: Home / Self Care | Payer: BLUE CROSS/BLUE SHIELD | Source: Ambulatory Visit | Attending: Radiation Oncology | Admitting: Radiation Oncology

## 2016-09-27 DIAGNOSIS — Z510 Encounter for antineoplastic radiation therapy: Secondary | ICD-10-CM | POA: Diagnosis not present

## 2016-09-28 ENCOUNTER — Ambulatory Visit
Admission: RE | Admit: 2016-09-28 | Discharge: 2016-09-28 | Disposition: A | Discharge status: Home / Self Care | Payer: BLUE CROSS/BLUE SHIELD | Source: Ambulatory Visit | Attending: Radiation Oncology | Admitting: Radiation Oncology

## 2016-09-28 DIAGNOSIS — Z510 Encounter for antineoplastic radiation therapy: Secondary | ICD-10-CM | POA: Diagnosis not present

## 2016-09-29 ENCOUNTER — Ambulatory Visit
Admission: RE | Admit: 2016-09-29 | Discharge: 2016-09-29 | Disposition: A | Discharge status: Home / Self Care | Payer: BLUE CROSS/BLUE SHIELD | Source: Ambulatory Visit | Attending: Radiation Oncology | Admitting: Radiation Oncology

## 2016-09-29 DIAGNOSIS — Z510 Encounter for antineoplastic radiation therapy: Secondary | ICD-10-CM | POA: Diagnosis not present

## 2016-09-30 ENCOUNTER — Ambulatory Visit
Admission: RE | Admit: 2016-09-30 | Discharge: 2016-09-30 | Disposition: A | Discharge status: Home / Self Care | Payer: BLUE CROSS/BLUE SHIELD | Source: Ambulatory Visit | Attending: Radiation Oncology | Admitting: Radiation Oncology

## 2016-09-30 DIAGNOSIS — Z510 Encounter for antineoplastic radiation therapy: Secondary | ICD-10-CM | POA: Diagnosis not present

## 2016-10-03 ENCOUNTER — Ambulatory Visit
Admission: RE | Admit: 2016-10-03 | Discharge: 2016-10-03 | Disposition: A | Discharge status: Home / Self Care | Payer: BLUE CROSS/BLUE SHIELD | Source: Ambulatory Visit | Attending: Radiation Oncology | Admitting: Radiation Oncology

## 2016-10-03 ENCOUNTER — Encounter: Payer: Self-pay | Admitting: Radiation Oncology

## 2016-10-03 VITALS — BP 121/75 | HR 74 | Temp 98.3°F | Ht 64.0 in | Wt 134.2 lb

## 2016-10-03 DIAGNOSIS — Z510 Encounter for antineoplastic radiation therapy: Secondary | ICD-10-CM | POA: Diagnosis not present

## 2016-10-03 DIAGNOSIS — C50411 Malignant neoplasm of upper-outer quadrant of right female breast: Secondary | ICD-10-CM

## 2016-10-03 DIAGNOSIS — Z170 Estrogen receptor positive status [ER+]: Principal | ICD-10-CM

## 2016-10-03 NOTE — Progress Notes (Signed)
Cassidy Mayer is here for her 9th fraction of radiation to her Right Breast. She reports soreness to her Right Breast when touched and with her bra. She reports some fatigue, but it working full time at this time. Her Right Breast is slightly hyperpigmented. She does have an area of irritation above her Right Nipple, but tells me . She is using the Radiaplex twice daily.   BP 121/75   Pulse 74   Temp 98.3 F (36.8 C)   Ht 5\' 4"  (1.626 m)   Wt 134 lb 3.2 oz (60.9 kg)   LMP 03/28/2016   SpO2 100% Comment: room air  BMI 23.04 kg/m    Wt Readings from Last 3 Encounters:  10/03/16 134 lb 3.2 oz (60.9 kg)  09/26/16 133 lb 12.8 oz (60.7 kg)  09/08/16 131 lb (59.4 kg)

## 2016-10-03 NOTE — Progress Notes (Signed)
   Weekly Management Note:  outpatient    ICD-9-CM ICD-10-CM   1. Carcinoma of upper-outer quadrant of right breast in female, estrogen receptor positive ([place]) 174.4 C50.411    V86.0 Z17.0     Current Dose:  24.03 Gy  Projected Dose: 50.05 Gy   Narrative:  The patient presents for routine under treatment assessment.  CBCT/MVCT images/Port film x-rays were reviewed.  The chart was checked. Tired, soreness of right breast.   Physical Findings:  height is 5\' 4"  (1.626 m) and weight is 134 lb 3.2 oz (60.9 kg). Her temperature is 98.3 F (36.8 C). Her blood pressure is 121/75 and her pulse is 74. Her oxygen saturation is 100%.   Wt Readings from Last 3 Encounters:  10/03/16 134 lb 3.2 oz (60.9 kg)  09/26/16 133 lb 12.8 oz (60.7 kg)  09/08/16 131 lb (59.4 kg)   Bruise-like appearance of UOQ of right breast. Skin is slightly hyperpigmented  Impression:  The patient is tolerating radiotherapy.  Plan:  Continue radiotherapy as planned. Patient instructed to apply Radiplex to intact skin in treatment fields.      ________________________________   Eppie Gibson, M.D.

## 2016-10-04 ENCOUNTER — Ambulatory Visit
Admission: RE | Admit: 2016-10-04 | Discharge: 2016-10-04 | Disposition: A | Discharge status: Home / Self Care | Payer: BLUE CROSS/BLUE SHIELD | Source: Ambulatory Visit | Attending: Radiation Oncology | Admitting: Radiation Oncology

## 2016-10-04 DIAGNOSIS — Z510 Encounter for antineoplastic radiation therapy: Secondary | ICD-10-CM | POA: Diagnosis not present

## 2016-10-05 ENCOUNTER — Ambulatory Visit
Admission: RE | Admit: 2016-10-05 | Discharge: 2016-10-05 | Disposition: A | Discharge status: Home / Self Care | Payer: BLUE CROSS/BLUE SHIELD | Source: Ambulatory Visit | Attending: Radiation Oncology | Admitting: Radiation Oncology

## 2016-10-05 DIAGNOSIS — Z510 Encounter for antineoplastic radiation therapy: Secondary | ICD-10-CM | POA: Diagnosis not present

## 2016-10-06 ENCOUNTER — Ambulatory Visit
Admission: RE | Admit: 2016-10-06 | Discharge: 2016-10-06 | Disposition: A | Discharge status: Home / Self Care | Payer: BLUE CROSS/BLUE SHIELD | Source: Ambulatory Visit | Attending: Radiation Oncology | Admitting: Radiation Oncology

## 2016-10-06 DIAGNOSIS — Z510 Encounter for antineoplastic radiation therapy: Secondary | ICD-10-CM | POA: Diagnosis not present

## 2016-10-07 ENCOUNTER — Ambulatory Visit
Admission: RE | Admit: 2016-10-07 | Discharge: 2016-10-07 | Disposition: A | Discharge status: Home / Self Care | Payer: BLUE CROSS/BLUE SHIELD | Source: Ambulatory Visit | Attending: Radiation Oncology | Admitting: Radiation Oncology

## 2016-10-07 DIAGNOSIS — C50411 Malignant neoplasm of upper-outer quadrant of right female breast: Secondary | ICD-10-CM

## 2016-10-07 DIAGNOSIS — Z170 Estrogen receptor positive status [ER+]: Principal | ICD-10-CM

## 2016-10-07 DIAGNOSIS — Z510 Encounter for antineoplastic radiation therapy: Secondary | ICD-10-CM | POA: Diagnosis not present

## 2016-10-07 MED ORDER — RADIAPLEXRX EX GEL
Freq: Once | CUTANEOUS | Status: AC
  Administered 2016-10-07: 14:00:00 via TOPICAL

## 2016-10-10 ENCOUNTER — Ambulatory Visit
Admission: RE | Admit: 2016-10-10 | Discharge: 2016-10-10 | Disposition: A | Discharge status: Home / Self Care | Payer: BLUE CROSS/BLUE SHIELD | Source: Ambulatory Visit | Attending: Radiation Oncology | Admitting: Radiation Oncology

## 2016-10-10 ENCOUNTER — Encounter: Payer: Self-pay | Admitting: Radiation Oncology

## 2016-10-10 VITALS — BP 103/68 | HR 72 | Temp 97.9°F | Resp 18 | Ht 64.0 in | Wt 136.6 lb

## 2016-10-10 DIAGNOSIS — Z510 Encounter for antineoplastic radiation therapy: Secondary | ICD-10-CM | POA: Diagnosis not present

## 2016-10-10 DIAGNOSIS — L814 Other melanin hyperpigmentation: Secondary | ICD-10-CM | POA: Insufficient documentation

## 2016-10-10 DIAGNOSIS — C50411 Malignant neoplasm of upper-outer quadrant of right female breast: Secondary | ICD-10-CM | POA: Diagnosis not present

## 2016-10-10 DIAGNOSIS — Z170 Estrogen receptor positive status [ER+]: Secondary | ICD-10-CM

## 2016-10-10 MED ORDER — SONAFINE EX EMUL
1.0000 "application " | Freq: Two times a day (BID) | CUTANEOUS | Status: DC
  Administered 2016-10-10: 1 via TOPICAL

## 2016-10-10 NOTE — Addendum Note (Signed)
Encounter addended by: Malena Edman, RN on: 10/10/2016  5:34 PM<BR>    Actions taken: MAR administration accepted

## 2016-10-10 NOTE — Addendum Note (Signed)
Encounter addended by: Malena Edman, RN on: 10/10/2016  5:32 PM<BR>    Actions taken: Order list changed, Diagnosis association updated

## 2016-10-10 NOTE — Progress Notes (Signed)
   Weekly Management Note:  outpatient    ICD-9-CM ICD-10-CM   1. Carcinoma of upper-outer quadrant of right breast in female, estrogen receptor positive ([place]) 174.4 C50.411    V86.0 Z17.0     Current Dose:  37.38 Gy  Projected Dose: 50.05 Gy   Narrative:  The patient presents for routine under treatment assessment.  CBCT/MVCT images/Port film x-rays were reviewed.  The chart was checked. Feels a burning sensation when she applies Radiaplex.  Physical Findings:  height is 5\' 4"  (1.626 m) and weight is 136 lb 9.6 oz (62 kg). Her oral temperature is 97.9 F (36.6 C). Her blood pressure is 103/68 and her pulse is 72. Her respiration is 18 and oxygen saturation is 100%.   Wt Readings from Last 3 Encounters:  10/10/16 136 lb 9.6 oz (62 kg)  10/03/16 134 lb 3.2 oz (60.9 kg)  09/26/16 133 lb 12.8 oz (60.7 kg)   Fainy hyperpigmentation - right breast.   Impression:  The patient is tolerating radiotherapy.  Plan:  Continue radiotherapy as planned. Patient instructed to apply Sonafine in treatment fields.      ________________________________   Eppie Gibson, M.D.

## 2016-10-10 NOTE — Progress Notes (Signed)
Cassidy Mayer is here for her 14th fraction of radiation to her Right Breast. She reports soreness to her Right Breast when touched and with her bra. Reports a rash to right breast upper and under the breast with itching. She reports some fatigue, but it working full time at this time. Her Right Breast is slightly hyperpigmented. She does have an area of irritation above her Right Nipple, skin is intact.   She is using the Radiaplex twice daily.  Wt Readings from Last 3 Encounters:  10/10/16 136 lb 9.6 oz (62 kg)  10/03/16 134 lb 3.2 oz (60.9 kg)  09/26/16 133 lb 12.8 oz (60.7 kg)  BP 103/68   Pulse 72   Temp 97.9 F (36.6 C) (Oral)   Resp 18   Ht 5\' 4"  (1.626 m)   Wt 136 lb 9.6 oz (62 kg)   LMP 03/28/2016   SpO2 100%   BMI 23.45 kg/m

## 2016-10-11 ENCOUNTER — Ambulatory Visit
Admission: RE | Admit: 2016-10-11 | Discharge: 2016-10-11 | Disposition: A | Discharge status: Home / Self Care | Payer: BLUE CROSS/BLUE SHIELD | Source: Ambulatory Visit | Attending: Radiation Oncology | Admitting: Radiation Oncology

## 2016-10-11 DIAGNOSIS — Z510 Encounter for antineoplastic radiation therapy: Secondary | ICD-10-CM | POA: Diagnosis not present

## 2016-10-12 ENCOUNTER — Ambulatory Visit
Admission: RE | Admit: 2016-10-12 | Discharge: 2016-10-12 | Disposition: A | Discharge status: Home / Self Care | Payer: BLUE CROSS/BLUE SHIELD | Source: Ambulatory Visit | Attending: Radiation Oncology | Admitting: Radiation Oncology

## 2016-10-12 DIAGNOSIS — Z510 Encounter for antineoplastic radiation therapy: Secondary | ICD-10-CM | POA: Diagnosis not present

## 2016-10-13 ENCOUNTER — Ambulatory Visit
Admission: RE | Admit: 2016-10-13 | Discharge: 2016-10-13 | Disposition: A | Discharge status: Home / Self Care | Payer: BLUE CROSS/BLUE SHIELD | Source: Ambulatory Visit | Attending: Radiation Oncology | Admitting: Radiation Oncology

## 2016-10-13 DIAGNOSIS — Z510 Encounter for antineoplastic radiation therapy: Secondary | ICD-10-CM | POA: Diagnosis not present

## 2016-10-14 ENCOUNTER — Ambulatory Visit
Admission: RE | Admit: 2016-10-14 | Discharge: 2016-10-14 | Disposition: A | Discharge status: Home / Self Care | Payer: BLUE CROSS/BLUE SHIELD | Source: Ambulatory Visit | Attending: Radiation Oncology | Admitting: Radiation Oncology

## 2016-10-14 DIAGNOSIS — Z510 Encounter for antineoplastic radiation therapy: Secondary | ICD-10-CM | POA: Diagnosis not present

## 2016-10-16 ENCOUNTER — Ambulatory Visit: Payer: BLUE CROSS/BLUE SHIELD

## 2016-10-17 ENCOUNTER — Ambulatory Visit: Payer: BLUE CROSS/BLUE SHIELD

## 2016-10-17 ENCOUNTER — Ambulatory Visit
Admission: RE | Admit: 2016-10-17 | Discharge: 2016-10-17 | Disposition: A | Discharge status: Home / Self Care | Payer: BLUE CROSS/BLUE SHIELD | Source: Ambulatory Visit | Attending: Radiation Oncology | Admitting: Radiation Oncology

## 2016-10-17 ENCOUNTER — Encounter: Payer: Self-pay | Admitting: Radiation Oncology

## 2016-10-17 ENCOUNTER — Encounter (HOSPITAL_COMMUNITY): Payer: Self-pay | Admitting: Emergency Medicine

## 2016-10-17 ENCOUNTER — Emergency Department (HOSPITAL_COMMUNITY): Payer: BLUE CROSS/BLUE SHIELD

## 2016-10-17 ENCOUNTER — Emergency Department (HOSPITAL_COMMUNITY)
Admission: EM | Admit: 2016-10-17 | Discharge: 2016-10-17 | Disposition: A | Discharge status: Home / Self Care | Payer: BLUE CROSS/BLUE SHIELD | Attending: Emergency Medicine | Admitting: Emergency Medicine

## 2016-10-17 VITALS — BP 118/79 | HR 85 | Temp 99.2°F | Resp 18 | Ht 64.0 in | Wt 131.6 lb

## 2016-10-17 DIAGNOSIS — Z87891 Personal history of nicotine dependence: Secondary | ICD-10-CM | POA: Diagnosis not present

## 2016-10-17 DIAGNOSIS — C50411 Malignant neoplasm of upper-outer quadrant of right female breast: Secondary | ICD-10-CM

## 2016-10-17 DIAGNOSIS — R1084 Generalized abdominal pain: Secondary | ICD-10-CM | POA: Insufficient documentation

## 2016-10-17 DIAGNOSIS — Z79899 Other long term (current) drug therapy: Secondary | ICD-10-CM | POA: Diagnosis not present

## 2016-10-17 DIAGNOSIS — Z91040 Latex allergy status: Secondary | ICD-10-CM | POA: Insufficient documentation

## 2016-10-17 DIAGNOSIS — Z510 Encounter for antineoplastic radiation therapy: Secondary | ICD-10-CM | POA: Diagnosis not present

## 2016-10-17 DIAGNOSIS — R1030 Lower abdominal pain, unspecified: Secondary | ICD-10-CM | POA: Diagnosis present

## 2016-10-17 DIAGNOSIS — Z170 Estrogen receptor positive status [ER+]: Principal | ICD-10-CM

## 2016-10-17 LAB — URINALYSIS, ROUTINE W REFLEX MICROSCOPIC
BILIRUBIN URINE: NEGATIVE
GLUCOSE, UA: NEGATIVE mg/dL
HGB URINE DIPSTICK: NEGATIVE
KETONES UR: NEGATIVE mg/dL
Leukocytes, UA: NEGATIVE
Nitrite: NEGATIVE
PH: 7 (ref 5.0–8.0)
Protein, ur: NEGATIVE mg/dL
SPECIFIC GRAVITY, URINE: 1.018 (ref 1.005–1.030)

## 2016-10-17 LAB — CBC
HEMATOCRIT: 36.3 % (ref 36.0–46.0)
HEMOGLOBIN: 12.2 g/dL (ref 12.0–15.0)
MCH: 27.9 pg (ref 26.0–34.0)
MCHC: 33.6 g/dL (ref 30.0–36.0)
MCV: 82.9 fL (ref 78.0–100.0)
Platelets: 124 10*3/uL — ABNORMAL LOW (ref 150–400)
RBC: 4.38 MIL/uL (ref 3.87–5.11)
RDW: 14.8 % (ref 11.5–15.5)
WBC: 4.6 10*3/uL (ref 4.0–10.5)

## 2016-10-17 LAB — COMPREHENSIVE METABOLIC PANEL
ALBUMIN: 4.1 g/dL (ref 3.5–5.0)
ALT: 22 U/L (ref 14–54)
AST: 22 U/L (ref 15–41)
Alkaline Phosphatase: 25 U/L — ABNORMAL LOW (ref 38–126)
Anion gap: 7 (ref 5–15)
BUN: 11 mg/dL (ref 6–20)
CHLORIDE: 107 mmol/L (ref 101–111)
CO2: 23 mmol/L (ref 22–32)
CREATININE: 0.7 mg/dL (ref 0.44–1.00)
Calcium: 9.3 mg/dL (ref 8.9–10.3)
GFR calc Af Amer: >60 mL/min (ref >60)
GFR calc non Af Amer: >60 mL/min (ref >60)
GLUCOSE: 90 mg/dL (ref 65–99)
POTASSIUM: 3.8 mmol/L (ref 3.5–5.1)
Sodium: 137 mmol/L (ref 135–145)
Total Bilirubin: 0.7 mg/dL (ref 0.3–1.2)
Total Protein: 7.8 g/dL (ref 6.5–8.1)

## 2016-10-17 LAB — POC URINE PREG, ED: Preg Test, Ur: NEGATIVE

## 2016-10-17 LAB — LIPASE, BLOOD: LIPASE: 24 U/L (ref 11–51)

## 2016-10-17 MED ORDER — IOPAMIDOL (ISOVUE-300) INJECTION 61%
100.0000 mL | Freq: Once | INTRAVENOUS | Status: AC | PRN
  Administered 2016-10-17: 100 mL via INTRAVENOUS

## 2016-10-17 MED ORDER — SODIUM CHLORIDE 0.9 % IJ SOLN
INTRAMUSCULAR | Status: AC
  Filled 2016-10-17: qty 50

## 2016-10-17 MED ORDER — IOPAMIDOL (ISOVUE-300) INJECTION 61%
INTRAVENOUS | Status: AC
  Filled 2016-10-17: qty 100

## 2016-10-17 NOTE — ED Provider Notes (Signed)
[facility] DEPT Provider Note   CSN: [id number] Arrival date & time: 10/17/16  1732     History   Chief Complaint Chief Complaint  Patient presents with  . Abdominal Pain    HPI Cassidy Mayer is a 50 y.o. female.  HPI Complains of lower abdominal pain crampy and gassy waxes and wanes onset 10/14/2016. Pain sometimes radiates to her right flank Pain is improved by passing gas per rectum not made worse by anything. Pain is mild at present. She denies anorexia she is presently hungry. Denies urinary symptoms. She had vaginal bleeding last week however has not had a menstrual period the past 6 months. No treatment prior to coming here. Pain is presently mild. Past Medical History:  Diagnosis Date  . Anxiety   . Breast mass, right   . Eczema     Patient Active Problem List   Diagnosis Date Noted  . Carcinoma of upper-outer quadrant of right breast in female, estrogen receptor positive ([place]) 09/06/2016    Past Surgical History:  Procedure Laterality Date  . BREAST LUMPECTOMY WITH RADIOACTIVE SEED LOCALIZATION Right 08/11/2016   Procedure: RIGHT BREAST LUMPECTOMY WITH RADIOACTIVE SEED LOCALIZATION;  Surgeon: Autumn Messing III, MD;  Location: [facility];  Service: General;  Laterality: Right;  . ELBOW SURGERY    . TUBAL LIGATION      OB History    No data available       Home Medications    Prior to Admission medications   Medication Sig Start Date End Date Taking? Authorizing Provider  acetaminophen (TYLENOL) 500 MG tablet Take 1,000 mg by mouth every 6 (six) hours as needed (pain).   Yes Historical Provider, MD  Calcium Carb-Cholecalciferol (CALCIUM-VITAMIN D) 600-400 MG-UNIT TABS Take by mouth.   Yes Historical Provider, MD  CHANTIX STARTING MONTH PAK 0.5 MG X 11 & 1 MG X 42 tablet TK UTD 08/26/16  Yes Historical Provider, MD  Cholecalciferol (VITAMIN D3) 2000 units TABS Take by mouth.   Yes Historical Provider, MD  CLOBETASOL PROPIONATE EX Apply 1  application topically every other day.    Yes Historical Provider, MD  esomeprazole (NEXIUM) 40 MG capsule Take 40 mg by mouth daily at 12 noon.   Yes Historical Provider, MD  ibuprofen (ADVIL,MOTRIN) 200 MG tablet Take 200 mg by mouth every 6 (six) hours as needed (pain).   Yes Historical Provider, MD  magnesium 30 MG tablet Take 30 mg by mouth 3 (three) times a week.    Yes Historical Provider, MD  [place] Name: Dermasmooth, spray for hair. Used every other night   Yes Historical Provider, MD  Wound Dressings (SONAFINE) Apply 1 application topically 2 (two) times daily.   Yes Historical Provider, MD  tamoxifen (NOLVADEX) 20 MG tablet Take 1 tablet (20 mg total) by mouth daily. 11/08/16   Nicholas Lose, MD    Family History No family history on file.  Social History Social History  Substance Use Topics  . Smoking status: Former Smoker    Packs/day: 0.25    Types: Cigars  . Smokeless tobacco: Never Used     Comment: smokes black and milds  . Alcohol use No     Allergies   Latex   Review of Systems Review of Systems  Constitutional: Negative.   HENT: Negative.   Respiratory: Negative.   Cardiovascular: Negative.   Gastrointestinal: Positive for abdominal pain.  Musculoskeletal: Negative.   Skin: Negative.   Allergic/Immunologic: Positive for immunocompromised state.  Currently being treated for breast cancer with radiation therapy  Neurological: Negative.   Psychiatric/Behavioral: Negative.   All other systems reviewed and are negative.    Physical Exam Updated Vital Signs BP 128/81   Pulse 65   Temp 98.1 F (36.7 C) (Oral)   Resp 15   LMP 10/09/2016 (Approximate)   SpO2 100%   Physical Exam  Constitutional: She appears well-developed and well-nourished.  HENT:  Head: Normocephalic and atraumatic.  Eyes: Conjunctivae are normal. Pupils are equal, round, and reactive to light.  Neck: Neck supple. No tracheal deviation present. No thyromegaly  present.  Cardiovascular: Normal rate and regular rhythm.   No murmur heard. Pulmonary/Chest: Effort normal and breath sounds normal.  Abdominal: Soft. Bowel sounds are normal. She exhibits no distension and no mass. There is tenderness. There is no rebound and no guarding. No hernia.  Tender at suprapubic area  Genitourinary:  Genitourinary Comments: Pelvic exam refused  Musculoskeletal: Normal range of motion. She exhibits no edema or tenderness.  Neurological: She is alert. Coordination normal.  Skin: Skin is warm and dry. No rash noted.  Psychiatric: She has a normal mood and affect.  Nursing note and vitals reviewed.    ED Treatments / Results  Labs (all labs ordered are listed, but only abnormal results are displayed) Labs Reviewed  LIPASE, BLOOD  COMPREHENSIVE METABOLIC PANEL  CBC  URINALYSIS, ROUTINE W REFLEX MICROSCOPIC (NOT AT [facility])  POC URINE PREG, ED   Declined pain medicine EKG  EKG Interpretation None       Radiology No results found.  Procedures Procedures (including critical care time)  Medications Ordered in ED Medications - No data to display  Results for orders placed or performed during the hospital encounter of 10/17/16  Lipase, blood  Result Value Ref Range   Lipase 24 11 - 51 U/L  Comprehensive metabolic panel  Result Value Ref Range   Sodium 137 135 - 145 mmol/L   Potassium 3.8 3.5 - 5.1 mmol/L   Chloride 107 101 - 111 mmol/L   CO2 23 22 - 32 mmol/L   Glucose, Bld 90 65 - 99 mg/dL   BUN 11 6 - 20 mg/dL   Creatinine, Ser 0.70 0.44 - 1.00 mg/dL   Calcium 9.3 8.9 - 10.3 mg/dL   Total Protein 7.8 6.5 - 8.1 g/dL   Albumin 4.1 3.5 - 5.0 g/dL   AST 22 15 - 41 U/L   ALT 22 14 - 54 U/L   Alkaline Phosphatase 25 (L) 38 - 126 U/L   Total Bilirubin 0.7 0.3 - 1.2 mg/dL   GFR calc non Af Amer >60 >60 mL/min   GFR calc Af Amer >60 >60 mL/min   Anion gap 7 5 - 15  CBC  Result Value Ref Range   WBC 4.6 4.0 - 10.5 K/uL   RBC 4.38 3.87 - 5.11  MIL/uL   Hemoglobin 12.2 12.0 - 15.0 g/dL   HCT 36.3 36.0 - 46.0 %   MCV 82.9 78.0 - 100.0 fL   MCH 27.9 26.0 - 34.0 pg   MCHC 33.6 30.0 - 36.0 g/dL   RDW 14.8 11.5 - 15.5 %   Platelets 124 (L) 150 - 400 K/uL  Urinalysis, Routine w reflex microscopic  Result Value Ref Range   Color, Urine YELLOW YELLOW   APPearance CLEAR CLEAR   Specific Gravity, Urine 1.018 1.005 - 1.030   pH 7.0 5.0 - 8.0   Glucose, UA NEGATIVE NEGATIVE mg/dL   Hgb  urine dipstick NEGATIVE NEGATIVE   Bilirubin Urine NEGATIVE NEGATIVE   Ketones, ur NEGATIVE NEGATIVE mg/dL   Protein, ur NEGATIVE NEGATIVE mg/dL   Nitrite NEGATIVE NEGATIVE   Leukocytes, UA NEGATIVE NEGATIVE  POC urine preg, ED (not at [facility])  Result Value Ref Range   Preg Test, Ur NEGATIVE NEGATIVE   Ct Abdomen Pelvis W Contrast  Result Date: 10/17/2016 CLINICAL DATA:  50 y/o F; lower abdominal pain. History of right breast cancer. EXAM: CT ABDOMEN AND PELVIS WITH CONTRAST TECHNIQUE: Multidetector CT imaging of the abdomen and pelvis was performed using the standard protocol following bolus administration of intravenous contrast. CONTRAST:  153mL ISOVUE-300 IOPAMIDOL (ISOVUE-300) INJECTION 61% COMPARISON:  None. FINDINGS: Lower chest: No acute abnormality. Hepatobiliary: No focal liver abnormality is seen. No gallstones, gallbladder wall thickening, or biliary dilatation. Nonspecific calcification along the right dome of liver measuring 10 mm (series 2, image 24) appear Pancreas: Unremarkable. No pancreatic ductal dilatation or surrounding inflammatory changes. Spleen: Normal in size without focal abnormality. Adrenals/Urinary Tract: Adrenal glands are unremarkable. Right kidney lower pole 8 mm cyst. Kidneys are otherwise normal, without renal calculi, focal lesion, or hydronephrosis. Bladder is unremarkable. Stomach/Bowel: Stomach is within normal limits. Appendix is large in size but without any evidence for appendicitis, likely normal variant. No evidence  of bowel wall thickening, distention, or inflammatory changes. Vascular/Lymphatic: Aortic atherosclerosis. No enlarged abdominal or pelvic lymph nodes. Reproductive: Uterine myomas measuring up to 22 mm in the dorsal myometrium. Retroflexed uterus. Bilateral adnexa are unremarkable. Trace pelvic fluid is likely physiologic. Other: No abdominal wall hernia or abnormality. No abdominopelvic ascites. Musculoskeletal: No acute or significant osseous findings. IMPRESSION: 1. No acute process identified as explanation for pain. 2. Aortic atherosclerosis. 3. Myomatous uterus. Electronically Signed   By: Kristine Garbe M.D.   On: 10/17/2016 19:34   Initial Impression / Assessment and Plan / ED Course  I have reviewed the triage vital signs and the nursing notes.  Pertinent labs & imaging results that were available during my care of the patient were reviewed by me and considered in my medical decision making (see chart for details).  Clinical Course     7:45 PM discomfort continues to improve as she passes gas per rectum No serious etiology of abdominal pain . Plan gasex;F/i pmd as needed.mild thrrombocytopenia not present 9 mo. ago Final Clinical Impressions(s) / ED Diagnoses   Final diagnoses:  None  Dx #1diffuse abdominal pain #2 thrombocytopenia  New Prescriptions New Prescriptions   No medications on file     Orlie Dakin, MD 10/17/16 1951

## 2016-10-17 NOTE — Discharge Instructions (Signed)
Take Gasex as directed. Report blood platelet count is slightly low at 124,000. Please contact your primary care provider or oncologist to get your blood platelet count rechecked within the next 2 or 3 weeks.Return if concerned for any reason

## 2016-10-17 NOTE — ED Notes (Signed)
Pt refuses pelvic exam. EDP informed.

## 2016-10-17 NOTE — ED Triage Notes (Signed)
Pt comes from the cancer center with complaints of lower abdominal pain that has been intermittent since Friday night.  Denies N&V and diarrhea.  Has right breast cancer.

## 2016-10-17 NOTE — Progress Notes (Signed)
   Weekly Management Note:  outpatient    ICD-9-CM ICD-10-CM   1. Carcinoma of upper-outer quadrant of right breast in female, estrogen receptor positive ([place]) 174.4 C50.411    V86.0 Z17.0     Current Dose:  48.05 Gy  Projected Dose: 50.05 Gy   Narrative:  The patient presents for routine under treatment assessment.  CBCT/MVCT images/Port film x-rays were reviewed.  The chart was checked.   Soreness of breast, right, and clear drainage yesterday from areola where skin is peeling.  Severe abdominal pain this morning after BM.  Frequent flatus.  Was at one point writhing on the bathroom floor in pain. Persistent LLQ and RLQ pain all day.  No diarrhea or vomiting or nausea. Low grade temp.  Physical Findings:  height is 5\' 4"  (1.626 m) and weight is 131 lb 9.6 oz (59.7 kg). Her oral temperature is 99.2 F (37.3 C). Her blood pressure is 118/79 and her pulse is 85. Her respiration is 18 and oxygen saturation is 100%.   Wt Readings from Last 3 Encounters:  10/17/16 131 lb 9.6 oz (59.7 kg)  10/10/16 136 lb 9.6 oz (62 kg)  10/03/16 134 lb 3.2 oz (60.9 kg)   Dryness and hyperpigmentation - right breast.  Areola - dryed scabbing laterally, no moisture noted today. Abdomen - normoactive bowel sounds, tender in lower quadrants,  voluntary guarding in LLQ and RLQ to palpation. Nondistended.  Impression:  The patient is tolerating radiotherapy.  Plan:  Continue radiotherapy as planned. Patient instructed to apply neosporin in peeling areas and Sonafine in treatment fields otherwise.  Sending patient to ER with nurse given her abdominal complaints to rule out surgical abdomen.   ________________________________   Eppie Gibson, M.D.

## 2016-10-17 NOTE — Progress Notes (Addendum)
   Cassidy Mayer is here for her 20 th fraction of radiation to her Right Breast. She reports soreness to her Right Breast when touched and with her bra. Reports a rash to right breast upper and under the breast with itching. She reports some fatigue, but it working full time at this time. Her Right Breast is slightly hyperpigmented. She does have an area of irritation above her Right Nipple, skin is peeling around the nipple.   She is using the Sonafine twice day since last Monday.  Started having flatus pain on Friday night, had good bowel movement 3-4 over the weekend.  Had a normal bowel movement this morning before the flatus pain started again 7/10 has Tylenol and Ibuprofen  For pain.  Had a good appetite over the weekend. Wt Readings from Last 3 Encounters:  10/17/16 131 lb 9.6 oz (59.7 kg)  10/10/16 136 lb 9.6 oz (62 kg)  10/03/16 134 lb 3.2 oz (60.9 kg)       5 pound weight loss over the past week. BP 117/81   Pulse 72   Temp 99.2 F (37.3 C) (Oral)   Resp 18   Ht 5\' 4"  (1.626 m)   Wt 131 lb 9.6 oz (59.7 kg)   LMP 03/28/2016   SpO2 100%   BMI 22.59 kg/m sitting BP 118/79   Pulse 85   Temp 99.2 F (37.3 C) (Oral)   Resp 18   Ht 5\' 4"  (1.626 m)   Wt 131 lb 9.6 oz (59.7 kg)   LMP 03/28/2016   SpO2 100%   BMI 22.59 kg/m  standing 515 pm Dr. Isidore Moos requested Miss Roberds to the ED because of abdominal pain and flatus that started over the weekend.  Asked to go directly to the Triage area because there was not a room for direct admission for evaluation 520 pm Called ED report given to Triage nurse. 525 pm To ED Triage area for by wheelchair daughter to ED with her mother for admission for evaluation of abdominal pain and flatus.

## 2016-10-17 NOTE — ED Notes (Signed)
Pt being sent by [facility].  C/o abdominal pain x 2 days.  Denies n/v/d.  Hx of R breast CA.  Radiation today.  No recent chemo.

## 2016-10-18 ENCOUNTER — Telehealth: Payer: Self-pay | Admitting: *Deleted

## 2016-10-18 ENCOUNTER — Ambulatory Visit: Payer: BLUE CROSS/BLUE SHIELD

## 2016-10-18 ENCOUNTER — Ambulatory Visit
Admission: RE | Admit: 2016-10-18 | Discharge: 2016-10-18 | Disposition: A | Discharge status: Home / Self Care | Payer: BLUE CROSS/BLUE SHIELD | Source: Ambulatory Visit | Attending: Radiation Oncology | Admitting: Radiation Oncology

## 2016-10-18 DIAGNOSIS — Z510 Encounter for antineoplastic radiation therapy: Secondary | ICD-10-CM | POA: Diagnosis not present

## 2016-10-18 NOTE — Telephone Encounter (Signed)
  Oncology Nurse Navigator Documentation  Navigator Location: [facility] (10/18/16 0900)   )Navigator Encounter Type: Telephone (10/18/16 0900) Telephone: [facility] Call (10/18/16 0900)                   Patient Visit Type: RadOnc (10/18/16 0900) Treatment Phase: Final Radiation Tx (10/18/16 0900)                  Acuity: Level 1 (10/18/16 0900)         Time Spent with Patient: 15 (10/18/16 0900)

## 2016-10-19 ENCOUNTER — Ambulatory Visit: Payer: BLUE CROSS/BLUE SHIELD

## 2016-10-21 ENCOUNTER — Ambulatory Visit: Payer: BLUE CROSS/BLUE SHIELD

## 2016-10-24 ENCOUNTER — Ambulatory Visit: Payer: BLUE CROSS/BLUE SHIELD

## 2016-10-25 ENCOUNTER — Ambulatory Visit: Payer: BLUE CROSS/BLUE SHIELD

## 2016-10-26 ENCOUNTER — Encounter: Payer: Self-pay | Admitting: Radiation Oncology

## 2016-10-26 ENCOUNTER — Ambulatory Visit: Payer: BLUE CROSS/BLUE SHIELD

## 2016-10-26 NOTE — Progress Notes (Signed)
  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 The patient experienced some soreness to the right breast, as well as clear drainage from the areola with skin peeling.    Plan: The patient has completed radiation treatment. The patient will return to radiation oncology clinic for routine followup in one month. I advised them to call or return sooner if they have any questions or concerns related to their recovery or treatment.  -----------------------------------  Eppie Gibson, MD  This document serves as a record of services personally performed by Eppie Gibson, MD. It was created on her behalf by Maryla Morrow, a trained medical scribe. The creation of this record is based on the scribe's personal observations and the provider's statements to them. This document has been checked and approved by the attending provider.

## 2016-10-27 ENCOUNTER — Ambulatory Visit: Payer: BLUE CROSS/BLUE SHIELD

## 2016-10-28 ENCOUNTER — Ambulatory Visit: Payer: BLUE CROSS/BLUE SHIELD

## 2016-10-31 ENCOUNTER — Ambulatory Visit: Payer: BLUE CROSS/BLUE SHIELD

## 2016-11-01 ENCOUNTER — Ambulatory Visit: Payer: BLUE CROSS/BLUE SHIELD

## 2016-11-02 ENCOUNTER — Ambulatory Visit: Payer: BLUE CROSS/BLUE SHIELD

## 2016-11-03 ENCOUNTER — Ambulatory Visit: Payer: BLUE CROSS/BLUE SHIELD

## 2016-11-04 ENCOUNTER — Ambulatory Visit: Payer: BLUE CROSS/BLUE SHIELD

## 2016-11-04 NOTE — Progress Notes (Signed)
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  The boost to the lumpectomy cavity will be delivered with 12 MeV electrons, with an en face field, and custom electron cut out block. Special Port plan approved.  10 Gy in 5 fractions has been prescribed to the 100% isodose line.  -----------------------------------  Eppie Gibson, MD

## 2016-11-07 ENCOUNTER — Ambulatory Visit: Payer: BLUE CROSS/BLUE SHIELD

## 2016-11-16 ENCOUNTER — Encounter: Payer: Self-pay | Admitting: Radiation Oncology

## 2016-11-18 ENCOUNTER — Ambulatory Visit
Admission: RE | Admit: 2016-11-18 | Discharge: 2016-11-18 | Disposition: A | Discharge status: Home / Self Care | Payer: BLUE CROSS/BLUE SHIELD | Source: Ambulatory Visit | Attending: Radiation Oncology | Admitting: Radiation Oncology

## 2016-11-18 ENCOUNTER — Encounter: Payer: Self-pay | Admitting: Radiation Oncology

## 2016-11-18 DIAGNOSIS — B029 Zoster without complications: Secondary | ICD-10-CM | POA: Diagnosis not present

## 2016-11-18 DIAGNOSIS — Z79899 Other long term (current) drug therapy: Secondary | ICD-10-CM | POA: Diagnosis not present

## 2016-11-18 DIAGNOSIS — C50411 Malignant neoplasm of upper-outer quadrant of right female breast: Secondary | ICD-10-CM | POA: Diagnosis not present

## 2016-11-18 DIAGNOSIS — Z170 Estrogen receptor positive status [ER+]: Secondary | ICD-10-CM | POA: Diagnosis not present

## 2016-11-18 HISTORY — DX: Personal history of irradiation: Z92.3

## 2016-11-18 NOTE — Progress Notes (Signed)
Radiation Oncology         [phone] ________________________________  Name: Cassidy Mayer MRN: [id number]  Date: 11/18/2016  DOB: Mar 25, 1966  Follow-Up Visit Note  Outpatient  CC: Jilda Panda, MD  Jilda Panda, MD  Diagnosis and Prior Radiotherapy:    ICD-9-CM ICD-10-CM   1. 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 She is doing well overall. She reports nerve pain to her Right Breast area from Shingles she had after her radiation completed. 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 She has hyperpigmentation present to her Right Breast. She peeled after radiation and has scarring visible from this. Shingle scars are visible to the lateral area of her Right Breast. She continues to use sonafine cream to her Right Breast. She did not use sonafine while she had shingles because it burned. She is also using aloe vera as needed. She is aware to switch to a Vitamin E cream when sonafine is completed. She is currently taking Tamoxifen which she started 11/02/16. She does report some hot flashes related to the Tamoxifen. She will see Dr. Lindi Adie on 12/17/15 for follow up.                               ALLERGIES:  is allergic to latex.  Meds: Current Outpatient Prescriptions  Medication Sig Dispense Refill  .  acetaminophen (TYLENOL) 500 MG tablet Take 1,000 mg by mouth every 6 (six) hours as needed (pain).    . Calcium Carb-Cholecalciferol (CALCIUM-VITAMIN D) 600-400 MG-UNIT TABS Take by mouth.    . CHANTIX STARTING MONTH PAK 0.5 MG X 11 & 1 MG X 42 tablet TK UTD  0  . Cholecalciferol (VITAMIN D3) 2000 units TABS Take by mouth.    . CLOBETASOL PROPIONATE EX Apply 1 application topically every other day.     . esomeprazole (NEXIUM) 40 MG capsule Take 40 mg by mouth daily at 12 noon.    Marland Kitchen ibuprofen (ADVIL,MOTRIN) 200 MG tablet Take 200 mg by mouth every 6 (six) hours as needed (pain).    . magnesium 30 MG tablet Take 30 mg by mouth 3 (three) times a week.     . tamoxifen (NOLVADEX) 20 MG tablet Take 1 tablet (20 mg total) by mouth daily. 90 tablet 3  . UNABLE TO FIND Med Name: Dermasmooth, spray for hair. Used every other night    . Wound Dressings (SONAFINE) Apply 1 application topically 2 (two) times daily.     No current facility-administered medications for this encounter.     Physical Findings:  height is 5\' 4"  (1.626 m) and weight is 132 lb 12.8 oz (60.2 kg). Her temperature is 98.2 F (36.8 C).  Her blood pressure is 123/75 and her pulse is 68. Her oxygen saturation is 100%. .    General: Alert and oriented, in no acute distress HEENT: Head is normocephalic. Neurologic: Speech is fluent. Coordination is intact. Psychiatric: Judgment and insight are intact. Affect is appropriate. Breast: Right breast residual hyperpigmentation with some islands of hypopigmentation where her skin had more irritation. Some dry desquamation along the lateral aspect of the right breast and axilla.  Lab Findings: Lab Results  Component Value Date   WBC 4.6 10/17/2016   HGB 12.2 10/17/2016   HCT 36.3 10/17/2016   MCV 82.9 10/17/2016   PLT 124 (L) 10/17/2016    Radiographic Findings: No results found.  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  I encouraged her to continue with yearly mammography and followup with medical oncology. I encouraged the patient to begin using Vitamin E oil two to three times daily to promote skin healing over the next few months. She sees [facility] [address] for dermatology and will continue to follow with them. I will see her back on an as-needed basis. I have encouraged her to call if she has any issues or concerns in the future. I wished her the very best.  _____________________________________   Eppie Gibson, MD   This document serves as a record of services personally performed by Eppie Gibson, MD. It was created on her behalf by Arlyce Harman, a trained medical scribe. The creation of this record is based on the scribe's personal observations and the provider's statements to them. This document has been checked and approved by the attending provider.

## 2016-11-18 NOTE — Progress Notes (Signed)
Cassidy Mayer presents for follow up of radiation completed 10/18/16 to her Right Breast. She reports nerve pain to her Right Breast area from Shingles she had after her radiation completed. She has hyperpigmentation present to her Right Breast. She peeled after radiation and has scarring visible from this. Shingle scars are visible to the lateral area of her Right Breast. She continues to use sonafine cream to her Right Breast. She is also using aloe vera as needed. She is aware to switch to a Vitamin E cream when sonafine is completed. She is currently taking Tamoxifen which she started 11/02/16. She does report some hot flashes related to the Tamoxifen. She will see Dr. Lindi Adie on 12/17/15 for follow up.   BP 123/75   Pulse 68   Temp 98.2 F (36.8 C)   Ht 5\' 4"  (1.626 m)   Wt 132 lb 12.8 oz (60.2 kg)   SpO2 100% Comment: room air  BMI 22.80 kg/m    Wt Readings from Last 3 Encounters:  11/18/16 132 lb 12.8 oz (60.2 kg)  10/17/16 131 lb 9.6 oz (59.7 kg)  10/10/16 136 lb 9.6 oz (62 kg)

## 2016-12-15 NOTE — Assessment & Plan Note (Signed)
Right lumpectomy 06/20/2016: Low-grade DCIS with calcifications, all margins are negative,size 0.5 cm,ER 100%, PR 70%, Tis N0 stage 0 Adj XRT 10/25 to 10/18/16  Treatment: Adj Tamoxifen 20 mg daily We discussed the risks and benefits of tamoxifen. These include but not limited to insomnia, hot flashes, mood changes, vaginal dryness, and weight gain. Although rare, serious side effects including endometrial cancer, risk of blood clots were also discussed. We strongly believe that the benefits far outweigh the risks. Patient understands these risks and consented to starting treatment. Planned treatment duration is 5 years.

## 2016-12-16 ENCOUNTER — Encounter: Payer: Self-pay | Admitting: Hematology and Oncology

## 2016-12-16 ENCOUNTER — Ambulatory Visit (HOSPITAL_BASED_OUTPATIENT_CLINIC_OR_DEPARTMENT_OTHER): Payer: BLUE CROSS/BLUE SHIELD | Admitting: Hematology and Oncology

## 2016-12-16 DIAGNOSIS — C50411 Malignant neoplasm of upper-outer quadrant of right female breast: Secondary | ICD-10-CM | POA: Diagnosis not present

## 2016-12-16 DIAGNOSIS — Z170 Estrogen receptor positive status [ER+]: Secondary | ICD-10-CM | POA: Diagnosis not present

## 2016-12-16 MED ORDER — VENLAFAXINE HCL ER 37.5 MG PO CP24: 37.5000 mg | ORAL_CAPSULE | Freq: Every day | ORAL | 6 refills | Status: DC

## 2016-12-16 NOTE — Progress Notes (Signed)
Patient Care Team: Jilda Panda, MD as PCP - General (Internal Medicine)  DIAGNOSIS:  Encounter Diagnosis  Name Primary?  . 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 She is currently on tamoxifen since a month. She reports that her hot flashes of gotten markedly worse. She's been getting a hot flash every 30-45 minutes. She has continued to remain on tamoxifen. She is yet not menopausal cause last menstrual period was November 2017. Prior to that she had a menstrual cycle 6 months before.  REVIEW OF SYSTEMS:   Constitutional: Denies fevers, chills or abnormal weight loss Eyes: Denies blurriness of vision Ears, nose, mouth, throat, and face: Denies mucositis or sore throat Respiratory: Denies cough, dyspnea or wheezes Cardiovascular: Denies palpitation, chest discomfort Gastrointestinal:  Denies nausea, heartburn or change in bowel habits Skin: Denies abnormal skin rashes Lymphatics: Denies new lymphadenopathy or easy bruising Neurological:Denies numbness, tingling or new weaknesses Behavioral/Psych: Mood is stable, no new changes  Extremities: No lower extremity edema Breast:  denies any pain or lumps or  nodules in either breasts All other systems were reviewed with the patient and are negative.  I have reviewed the past medical history, past surgical history, social history and family history with the patient and they are unchanged from previous note.  ALLERGIES:  is allergic to latex.  MEDICATIONS:  Current Outpatient Prescriptions  Medication Sig Dispense Refill  . acetaminophen (TYLENOL) 500 MG tablet Take 1,000 mg by mouth every 6 (six) hours as needed (pain).    . Calcium Carb-Cholecalciferol (CALCIUM-VITAMIN D) 600-400 MG-UNIT TABS Take by mouth.    . CHANTIX STARTING MONTH PAK 0.5 MG X 11 & 1 MG X 42 tablet TK UTD  0  . Cholecalciferol (VITAMIN D3) 2000 units TABS Take by mouth.    . CLOBETASOL PROPIONATE EX Apply 1 application topically every other day.     . esomeprazole (NEXIUM) 40 MG capsule Take 40 mg by mouth daily at 12 noon.    Marland Kitchen ibuprofen (ADVIL,MOTRIN) 200 MG tablet Take 200 mg by mouth every 6 (six) hours as needed (pain).    . magnesium 30 MG tablet Take 30 mg by mouth 3 (three) times a week.     . tamoxifen (NOLVADEX) 20 MG tablet Take 1 tablet (20 mg total) by mouth daily. 90 tablet 3  . UNABLE TO FIND Med Name: Dermasmooth, spray for hair. Used every other night    . venlafaxine XR (EFFEXOR-XR) 37.5 MG 24 hr capsule Take 1 capsule (37.5 mg total) by mouth daily with breakfast. 30 capsule 6   No current facility-administered medications for this visit.     PHYSICAL EXAMINATION: ECOG PERFORMANCE STATUS: 1 -  Symptomatic but completely ambulatory  Vitals:   12/16/16 1213  BP: 123/81  Pulse: 85  Resp: 18  Temp: 97.9 F (36.6 C)   Filed Weights   12/16/16 1213  Weight: 134 lb 1.6 oz (60.8 kg)    GENERAL:alert, no distress and comfortable SKIN: skin color, texture, turgor are normal, no rashes or significant lesions EYES: normal, Conjunctiva are pink and non-injected, sclera clear OROPHARYNX:no exudate, no erythema and lips, buccal mucosa, and tongue normal   NECK: supple, thyroid normal size, non-tender, without nodularity LYMPH:  no palpable lymphadenopathy in the cervical, axillary or inguinal LUNGS: clear to auscultation and percussion with normal breathing effort HEART: regular rate & rhythm and no murmurs and no lower extremity edema ABDOMEN:abdomen soft, non-tender and normal bowel sounds MUSCULOSKELETAL:no cyanosis of digits and no clubbing  NEURO: alert & oriented x 3 with fluent speech, no focal motor/sensory deficits EXTREMITIES: No lower extremity edema  LABORATORY DATA:  I have reviewed the data as listed   Chemistry      Component Value Date/Time   NA 137 10/17/2016 1756   K 3.8 10/17/2016 1756   CL 107 10/17/2016 1756   CO2 23 10/17/2016 1756   BUN 11 10/17/2016 1756   CREATININE 0.70 10/17/2016 1756      Component Value Date/Time   CALCIUM 9.3 10/17/2016 1756   ALKPHOS 25 (L) 10/17/2016 1756   AST 22 10/17/2016 1756   ALT 22 10/17/2016 1756   BILITOT 0.7 10/17/2016 1756       Lab Results  Component Value Date   WBC 4.6 10/17/2016   HGB 12.2 10/17/2016   HCT 36.3 10/17/2016   MCV 82.9 10/17/2016   PLT 124 (L) 10/17/2016   ASSESSMENT & PLAN:  Carcinoma of upper-outer quadrant of right breast in female, estrogen receptor positive ([place]) Right lumpectomy 06/20/2016: Low-grade DCIS with calcifications, all margins are negative,size 0.5 cm,ER 100%, PR 70%, Tis N0 stage 0 Adj XRT 10/25 to 10/18/16  Treatment: Adj Tamoxifen 20 mg daily Started December 2017 Tamoxifen toxicities: 1. Profound hot flashes almost every 30 minutes: I will start her on Effexor XR 37.5 mg daily. If in a month her symptoms do not improve, then she will reduce the dosage of tamoxifen to 10 mg daily.  Return to clinic in 3 months for follow-up I spent 15 minutes talking to the patient of which more than half was spent in counseling and coordination of care.  No orders of the defined types were placed in this encounter.  The patient has  a good understanding of the overall plan. she agrees with it. she will call with any problems that may develop before the next visit here.   Rulon Eisenmenger, MD 12/16/16

## 2016-12-19 NOTE — Progress Notes (Unsigned)
Received fax from [facility] office regarding pt having R vesicular chest wall rash that pt experience

## 2017-01-23 ENCOUNTER — Other Ambulatory Visit: Payer: Self-pay | Admitting: General Surgery

## 2017-01-23 DIAGNOSIS — Z853 Personal history of malignant neoplasm of breast: Secondary | ICD-10-CM

## 2017-02-14 ENCOUNTER — Telehealth: Payer: Self-pay | Admitting: Adult Health

## 2017-02-14 ENCOUNTER — Telehealth: Payer: Self-pay | Admitting: Hematology and Oncology

## 2017-02-14 NOTE — Telephone Encounter (Signed)
Left message with appt time and date for Survivorship. Sent appt letter.

## 2017-02-14 NOTE — Telephone Encounter (Signed)
Patient called to reschedule SCP appointment. 02/14/17

## 2017-03-14 ENCOUNTER — Telehealth: Payer: Self-pay | Admitting: Hematology and Oncology

## 2017-03-14 NOTE — Telephone Encounter (Signed)
lvm to inform ptof r/s 4/20 appt to 5/8 at 1145 am per MD out of office

## 2017-03-17 ENCOUNTER — Ambulatory Visit: Payer: BLUE CROSS/BLUE SHIELD | Admitting: Hematology and Oncology

## 2017-04-04 ENCOUNTER — Encounter: Payer: Self-pay | Admitting: Hematology and Oncology

## 2017-04-04 ENCOUNTER — Ambulatory Visit (HOSPITAL_BASED_OUTPATIENT_CLINIC_OR_DEPARTMENT_OTHER): Payer: BLUE CROSS/BLUE SHIELD | Admitting: Hematology and Oncology

## 2017-04-04 DIAGNOSIS — Z79811 Long term (current) use of aromatase inhibitors: Secondary | ICD-10-CM | POA: Diagnosis not present

## 2017-04-04 DIAGNOSIS — C50411 Malignant neoplasm of upper-outer quadrant of right female breast: Secondary | ICD-10-CM | POA: Diagnosis not present

## 2017-04-04 DIAGNOSIS — Z170 Estrogen receptor positive status [ER+]: Secondary | ICD-10-CM | POA: Diagnosis not present

## 2017-04-04 MED ORDER — ANASTROZOLE 1 MG PO TABS: 1.0000 mg | ORAL_TABLET | Freq: Every day | ORAL | 3 refills | Status: DC

## 2017-04-04 MED ORDER — VITAMIN D3 50 MCG (2000 UT) PO TABS: 1000.0000 [IU] | ORAL_TABLET | Freq: Every day | ORAL | Status: AC

## 2017-04-04 NOTE — Progress Notes (Signed)
Patient Care Team: Jilda Panda, MD as PCP - General (Internal Medicine)  DIAGNOSIS:  Encounter Diagnosis  Name Primary?  . 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      08/11/2016 Surgery    Right lumpectomy: Low-grade DCIS with calcifications, all margins are negative,size 0.5 cm,ER 100%, PR 70%, Tis N0 stage 0      09/21/2016 - 10/18/2016 Radiation Therapy    Adj XRT      11/15/2016 -  Anti-estrogen oral therapy    Tamoxifen 20 mg daily switched to anastrozole 04/04/2017 when she became menopausal       CHIEF COMPLIANT: Follow-up on tamoxifen therapy continuing to have severe hot flashes  INTERVAL HISTORY: ABCDE Mayer is a 51 year old with above-mentioned history of right breast cancer currently on tamoxifen and continues to have hot flashes every 30 minutes. We tried Effexor but she could not tolerate it because of nausea and vomiting. She is putting up with these hot flashes which are accompanied by severe sweats as well. However her last menstrual cycle was May 2017. She was wondering if she is menopausal and whether she should change from tamoxifen to aromatase inhibitor therapy.  REVIEW OF SYSTEMS:   Constitutional: Denies fevers, chills or abnormal weight loss Eyes: Denies blurriness of vision Ears, nose, mouth, throat, and face: Denies mucositis or sore throat Respiratory: Denies cough, dyspnea or wheezes Cardiovascular: Denies palpitation, chest discomfort Gastrointestinal:  Denies nausea, heartburn or change in bowel habits Skin: Denies abnormal skin rashes Lymphatics: Denies new lymphadenopathy or easy bruising Neurological:Denies numbness, tingling or new  weaknesses Behavioral/Psych: Mood is stable, no new changes  Extremities: No lower extremity edema Breast:  denies any pain or lumps or nodules in either breasts All other systems were reviewed with the patient and are negative.  I have reviewed the past medical history, past surgical history, social history and family history with the patient and they are unchanged from previous note.  ALLERGIES:  is allergic to latex.  MEDICATIONS:  Current Outpatient Prescriptions  Medication Sig Dispense Refill  . acetaminophen (TYLENOL) 500 MG tablet Take 1,000 mg by mouth every 6 (six) hours as needed (pain).    Marland Kitchen anastrozole (ARIMIDEX) 1 MG tablet Take 1 tablet (1 mg total) by mouth daily. 90 tablet 3  . Calcium Carb-Cholecalciferol (CALCIUM-VITAMIN D) 600-400 MG-UNIT TABS Take by mouth.    . Cholecalciferol (VITAMIN D3) 2000 units TABS Take 1,000 Units by mouth daily. 30 tablet   . CLOBETASOL PROPIONATE EX Apply 1 application topically every other day.     . esomeprazole (NEXIUM) 40 MG capsule Take 40 mg by mouth daily at 12 noon.    Marland Kitchen ibuprofen (ADVIL,MOTRIN) 200 MG tablet Take 200 mg by mouth every 6 (six) hours as needed (pain).    . magnesium 30 MG tablet Take 30 mg by mouth 3 (three) times a week.     . Turmeric 500 MG CAPS Take 500 mg by mouth daily.    Marland Kitchen UNABLE TO FIND Med Name: Dermasmooth, spray for hair. Used every other night    . vitamin C (ASCORBIC ACID) 500 MG tablet Take 500 mg by mouth daily.     No current facility-administered medications for this visit.  PHYSICAL EXAMINATION: ECOG PERFORMANCE STATUS: 1 - Symptomatic but completely ambulatory  Vitals:   04/04/17 1152  BP: 117/71  Pulse: 72  Resp: 18  Temp: 98.1 F (36.7 C)   Filed Weights   04/04/17 1152  Weight: 136 lb 9.6 oz (62 kg)    GENERAL:alert, no distress and comfortable SKIN: skin color, texture, turgor are normal, no rashes or significant lesions EYES: normal, Conjunctiva are pink and non-injected,  sclera clear OROPHARYNX:no exudate, no erythema and lips, buccal mucosa, and tongue normal  NECK: supple, thyroid normal size, non-tender, without nodularity LYMPH:  no palpable lymphadenopathy in the cervical, axillary or inguinal LUNGS: clear to auscultation and percussion with normal breathing effort HEART: regular rate & rhythm and no murmurs and no lower extremity edema ABDOMEN:abdomen soft, non-tender and normal bowel sounds MUSCULOSKELETAL:no cyanosis of digits and no clubbing  NEURO: alert & oriented x 3 with fluent speech, no focal motor/sensory deficits EXTREMITIES: No lower extremity edema  LABORATORY DATA:  I have reviewed the data as listed   Chemistry      Component Value Date/Time   NA 137 10/17/2016 1756   K 3.8 10/17/2016 1756   CL 107 10/17/2016 1756   CO2 23 10/17/2016 1756   BUN 11 10/17/2016 1756   CREATININE 0.70 10/17/2016 1756      Component Value Date/Time   CALCIUM 9.3 10/17/2016 1756   ALKPHOS 25 (L) 10/17/2016 1756   AST 22 10/17/2016 1756   ALT 22 10/17/2016 1756   BILITOT 0.7 10/17/2016 1756       Lab Results  Component Value Date   WBC 4.6 10/17/2016   HGB 12.2 10/17/2016   HCT 36.3 10/17/2016   MCV 82.9 10/17/2016   PLT 124 (L) 10/17/2016    ASSESSMENT & PLAN:  Carcinoma of upper-outer quadrant of right breast in female, estrogen receptor positive ([place]) Right lumpectomy 06/20/2016: Low-grade DCIS with calcifications, all margins are negative,size 0.5 cm,ER 100%, PR 70%, Tis N0 stage 0 Adj XRT 10/25 to 10/18/16  Treatment: Adj Tamoxifen 20 mg daily Started December 2017 Switched to anastrozole 04/04/2017 once she was determined to be menopausal Anastrozole counseling:We discussed the risks and benefits of anti-estrogen therapy with aromatase inhibitors. These include but not limited to insomnia, hot flashes, mood changes, vaginal dryness, bone density loss, and weight gain. We strongly believe that the benefits far outweigh the risks.  Patient understands these risks and consented to starting treatment. Planned treatment duration is 5 years.  Tamoxifen toxicities: 1. Profound hot flashes almost every 30 minutes: She could not tolerate Effexor. Since we switched her toAnastrozole, we will wait to see how she does with regards to hot flashes. Alternatively we can send her a prescription for gabapentin.  Return to clinic in 6 months for follow-up  I spent 25 minutes talking to the patient of which more than half was spent in counseling and coordination of care.  No orders of the defined types were placed in this encounter.  The patient has a good understanding of the overall plan. she agrees with it. she will call with any problems that may develop before the next visit here.   Rulon Eisenmenger, MD 04/04/17

## 2017-04-04 NOTE — Assessment & Plan Note (Signed)
Right lumpectomy 06/20/2016: Low-grade DCIS with calcifications, all margins are negative,size 0.5 cm,ER 100%, PR 70%, Tis N0 stage 0 Adj XRT 10/25 to 10/18/16  Treatment: Adj Tamoxifen 20 mg daily Started December 2017 Tamoxifen toxicities: 1. Profound hot flashes almost every 30 minutes: I started her on Effexor XR 37.5 mg daily. If in a month her symptoms do not improve, then she will reduce the dosage of tamoxifen to 10 mg daily.  Return to clinic in 6 months for follow-up

## 2017-04-21 ENCOUNTER — Ambulatory Visit (HOSPITAL_BASED_OUTPATIENT_CLINIC_OR_DEPARTMENT_OTHER): Payer: BLUE CROSS/BLUE SHIELD | Admitting: Adult Health

## 2017-04-21 ENCOUNTER — Encounter: Payer: Self-pay | Admitting: Adult Health

## 2017-04-21 VITALS — BP 107/66 | HR 88 | Temp 98.5°F | Resp 18 | Ht 64.0 in | Wt 139.5 lb

## 2017-04-21 DIAGNOSIS — Z79811 Long term (current) use of aromatase inhibitors: Secondary | ICD-10-CM | POA: Diagnosis not present

## 2017-04-21 DIAGNOSIS — Z170 Estrogen receptor positive status [ER+]: Secondary | ICD-10-CM

## 2017-04-21 DIAGNOSIS — Z86000 Personal history of in-situ neoplasm of breast: Secondary | ICD-10-CM | POA: Diagnosis not present

## 2017-04-21 DIAGNOSIS — E2839 Other primary ovarian failure: Secondary | ICD-10-CM | POA: Diagnosis not present

## 2017-04-21 DIAGNOSIS — C50411 Malignant neoplasm of upper-outer quadrant of right female breast: Secondary | ICD-10-CM

## 2017-04-21 NOTE — Progress Notes (Signed)
CLINIC:  Survivorship   REASON FOR VISIT:  Routine follow-up post-treatment for a recent history of breast cancer.  BRIEF ONCOLOGIC HISTORY:    Carcinoma of upper-outer quadrant of right breast in female, estrogen receptor positive ([place])   06/20/2016 Initial Diagnosis    Right breast biopsy: Focal atypical ductal hyperplasia with fibrocystic changes      08/11/2016 Surgery    Right lumpectomy: Low-grade DCIS with calcifications, all margins are negative,size 0.5 cm,ER 100%, PR 70%, Tis N0 stage 0      09/21/2016 - 10/18/2016 Radiation Therapy    Adj XRT      11/15/2016 -  Anti-estrogen oral therapy    Tamoxifen 20 mg daily switched to anastrozole 04/04/2017 when she became menopausal       INTERVAL HISTORY:  Cassidy Mayer presents to the [facility] today for our initial meeting to review her survivorship care plan detailing her treatment course for breast cancer, as well as monitoring long-term side effects of that treatment, education regarding health maintenance, screening, and overall wellness and health promotion.     Overall, Cassidy Mayer reports feeling quite well.  She is taking Anastrozole daily.  She is tolerating it very well.  She denies any hot flashes, vaginal issues, or joint aches/pains.      REVIEW OF SYSTEMS:  Review of Systems  Constitutional: Negative for appetite change, chills, diaphoresis, fatigue, fever and unexpected weight change.  HENT:   Negative for hearing loss and lump/mass.   Eyes: Negative for eye problems and icterus.  Respiratory: Negative for chest tightness, cough and shortness of breath.   Cardiovascular: Negative for chest pain, leg swelling and palpitations.  Gastrointestinal: Negative for abdominal distention, abdominal pain, constipation, diarrhea and nausea.  Endocrine: Negative for hot flashes.  Genitourinary: Negative for difficulty urinating and dyspareunia.   Musculoskeletal: Negative for arthralgias.  Skin: Negative for  itching and rash.  Neurological: Negative for dizziness, extremity weakness, light-headedness, numbness and speech difficulty.  Hematological: Negative for adenopathy. Does not bruise/bleed easily.  Psychiatric/Behavioral: Negative for depression. The patient is not nervous/anxious.   Breast: Denies any new nodularity, masses, tenderness, nipple changes, or nipple discharge.      ONCOLOGY TREATMENT TEAM:  1. Surgeon:  Dr. Marlou Starks at [facility] 2. Medical Oncologist: Dr. Lindi Adie 3. Radiation Oncologist: Dr. Isidore Moos    PAST MEDICAL/SURGICAL HISTORY:  Past Medical History:  Diagnosis Date  . Anxiety   . Breast mass, right   . Eczema   . History of radiation therapy 09/21/16- 10/18/16   Right Breast 40.05 Gy in 15 fractions. Right Breast boost of 5 fractions.    Past Surgical History:  Procedure Laterality Date  . BREAST LUMPECTOMY WITH RADIOACTIVE SEED LOCALIZATION Right 08/11/2016   Procedure: RIGHT BREAST LUMPECTOMY WITH RADIOACTIVE SEED LOCALIZATION;  Surgeon: Autumn Messing III, MD;  Location: [facility];  Service: General;  Laterality: Right;  . ELBOW SURGERY    . TUBAL LIGATION       ALLERGIES:  Allergies  Allergen Reactions  . Latex Swelling and Rash     CURRENT MEDICATIONS:  Outpatient Encounter Prescriptions as of 04/21/2017  Medication Sig Note  . acetaminophen (TYLENOL) 500 MG tablet Take 1,000 mg by mouth every 6 (six) hours as needed (pain).   Marland Kitchen anastrozole (ARIMIDEX) 1 MG tablet Take 1 tablet (1 mg total) by mouth daily.   . Calcium Carb-Cholecalciferol (CALCIUM-VITAMIN D) 600-400 MG-UNIT TABS Take by mouth.   . Cholecalciferol (VITAMIN D3) 2000 units TABS Take  1,000 Units by mouth daily.   Marland Kitchen CLOBETASOL PROPIONATE EX Apply 1 application topically every other day.  04/21/2017: One for body and one for face  . esomeprazole (NEXIUM) 40 MG capsule Take 40 mg by mouth daily at 12 noon.   Marland Kitchen ibuprofen (ADVIL,MOTRIN) 200 MG tablet Take 200 mg by  mouth every 6 (six) hours as needed (pain).   . magnesium 30 MG tablet Take 30 mg by mouth 3 (three) times a week.    . Turmeric 500 MG CAPS Take 500 mg by mouth daily.   Marland Kitchen UNABLE TO FIND Med Name: Dermasmooth, spray for hair. Used every other night   . vitamin C (ASCORBIC ACID) 500 MG tablet Take 500 mg by mouth daily.    No facility-administered encounter medications on file as of 04/21/2017.      ONCOLOGIC FAMILY HISTORY:  Family History  Problem Relation Age of Onset  . Breast cancer Neg Hx   . Colon cancer Neg Hx   . Prostate cancer Neg Hx   . Lung cancer Neg Hx      GENETIC COUNSELING/TESTING: Not indicated  SOCIAL HISTORY:  Cassidy Mayer is single and lives with her two daughters ages 52 and 63 in [place], [place].  She also has an older son who is 64 and lives in [place], [place].  [name] is currently working full time at [employer].  She denies any current or history of tobacco, alcohol, or illicit drug use.     PHYSICAL EXAMINATION:  Vital Signs:   Vitals:   04/21/17 1330  BP: 107/66  Pulse: 88  Resp: 18  Temp: 98.5 F (36.9 C)   Filed Weights   04/21/17 1330  Weight: 139 lb 8 oz (63.3 kg)   General: Well-nourished, well-appearing female in no acute distress.  She is unaccompanied today.   HEENT: Head is normocephalic.  Pupils equal and reactive to light. Conjunctivae clear without exudate.  Sclerae anicteric. Oral mucosa is pink, moist.  Oropharynx is pink without lesions or erythema.  Lymph: No cervical, supraclavicular, or infraclavicular lymphadenopathy noted on palpation.  Cardiovascular: Regular rate and rhythm.Marland Kitchen Respiratory: Clear to auscultation bilaterally. Chest expansion symmetric; breathing non-labored.  GI: Abdomen soft and round; non-tender, non-distended. Bowel sounds normoactive.  GU: Deferred.  Neuro: No focal deficits. Steady gait.  Psych: Mood and affect normal and appropriate for situation.  Extremities: No  edema. Skin: Warm and dry.  LABORATORY DATA:  None for this visit.  DIAGNOSTIC IMAGING:  None for this visit.      ASSESSMENT AND PLAN:  Ms.. Imperato is a pleasant 51 y.o. female with Stage 0 right breast DCIS, ER+/PR+/HER2-, diagnosed in 05/2016, treated with lumpectomy, adjuvant radiation therapy, and anti-estrogen therapy with Tamoxifen x 6 months and now Anastrozole starting 03/2017.  She presents to the Survivorship Clinic for our initial meeting and routine follow-up post-completion of treatment for breast cancer.    1. Stage 0 right breast cancer:  Ms. Pollio is continuing to recover from definitive treatment for breast cancer. She will follow-up with her medical oncologist, Dr. Lindi Adie in 09/2017 with history and physical exam per surveillance protocol.  She will continue her anti-estrogen therapy with Tamoxifen. Thus far, she is tolerating the Tamoxifen well, with minimal side effects. She was instructed to make Dr. Lindi Adie or myself aware if she begins to experience any worsening side effects of the medication and I could see her back in clinic to help manage those side effects, as needed.  Today, a comprehensive survivorship care plan and treatment summary was reviewed with the patient today detailing her breast cancer diagnosis, treatment course, potential late/long-term effects of treatment, appropriate follow-up care with recommendations for the future, and patient education resources.  A copy of this summary, along with a letter will be sent to the patient's primary care provider via mail/fax/In Basket message after today's visit.    2. Bone health:  Given Ms. Fulginiti's age/history of breast cancer and her current treatment regimen including anti-estrogen therapy with Anastrozole, she is at risk for bone demineralization.  I have ordered her a bone density test for when her mammogram is due in July.  In the meantime, she was encouraged to increase her consumption of foods rich in calcium, as  well as increase her weight-bearing activities.  She was given education on specific activities to promote bone health.  3. Cancer screening:  Due to Ms. Parzych's history and her age, she should receive screening for skin cancers, colon cancer, and gynecologic cancers.  The information and recommendations are listed on the patient's comprehensive care plan/treatment summary and were reviewed in detail with the patient.    4. Health maintenance and wellness promotion: Ms. Joos was encouraged to consume 5-7 servings of fruits and vegetables per day. We reviewed the "Nutrition Rainbow" handout, as well as the handout "Take Control of Your Health and Reduce Your Cancer Risk" from the [place].  She was also encouraged to engage in moderate to vigorous exercise for 30 minutes per day most days of the week. We discussed the [employer] fitness program, which is designed for cancer survivors to help them become more physically fit after cancer treatments.  She was instructed to limit her alcohol consumption and continue to abstain from tobacco use.     5. Support services/counseling: It is not uncommon for this period of the patient's cancer care trajectory to be one of many emotions and stressors.  We discussed an opportunity for her to participate in the next session of [facility] ("Finding Your New Normal") support group series designed for patients after they have completed treatment.   Ms. Everitt was encouraged to take advantage of our many other support services programs, support groups, and/or counseling in coping with her new life as a cancer survivor after completing anti-cancer treatment.  She was offered support today through active listening and expressive supportive counseling.  She was given information regarding our available services and encouraged to contact me with any questions or for help enrolling in any of our support group/programs.    Dispo:   -Return to cancer center for  follow up with Dr. Lindi Adie in 09/2017 -Mammogram in 05/2017 -F/u with Dr. Marlou Starks 05/2017 -She is welcome to return back to the Survivorship Clinic at any time; no additional follow-up needed at this time.  -Consider referral back to survivorship as a long-term survivor for continued surveillance  A total of (30) minutes of face-to-face time was spent with this patient with greater than 50% of that time in counseling and care-coordination.   Gardenia Phlegm, NP Survivorship Program [place] [phone]   Note: PRIMARY CARE PROVIDER Jilda Panda, [place] [phone]

## 2017-05-18 ENCOUNTER — Encounter: Payer: BLUE CROSS/BLUE SHIELD | Admitting: Adult Health

## 2017-06-12 ENCOUNTER — Ambulatory Visit
Admission: RE | Admit: 2017-06-12 | Discharge: 2017-06-12 | Disposition: A | Discharge status: Home / Self Care | Payer: BLUE CROSS/BLUE SHIELD | Source: Ambulatory Visit | Attending: Adult Health | Admitting: Adult Health

## 2017-06-12 ENCOUNTER — Ambulatory Visit
Admission: RE | Admit: 2017-06-12 | Discharge: 2017-06-12 | Disposition: A | Discharge status: Home / Self Care | Payer: BLUE CROSS/BLUE SHIELD | Source: Ambulatory Visit | Attending: General Surgery | Admitting: General Surgery

## 2017-06-12 DIAGNOSIS — E2839 Other primary ovarian failure: Secondary | ICD-10-CM

## 2017-06-12 DIAGNOSIS — Z853 Personal history of malignant neoplasm of breast: Secondary | ICD-10-CM

## 2017-06-12 DIAGNOSIS — Z79811 Long term (current) use of aromatase inhibitors: Secondary | ICD-10-CM

## 2017-06-12 HISTORY — DX: Personal history of irradiation: Z92.3

## 2017-07-17 IMAGING — CT CT ABD-PELV W/ CM
2 of 5 series · 16 of 46 positions shown, 18 images · IV contrast (ISOVUE)
Comparison: None.

CLINICAL DATA: 50 y/o F; lower abdominal pain. History of right
breast cancer.

EXAM:
CT ABDOMEN AND PELVIS WITH CONTRAST
TECHNIQUE: Multidetector CT imaging of the abdomen and pelvis was performed
using the standard protocol following bolus administration of
intravenous contrast.
CONTRAST:  100mL [id number] IOPAMIDOL (ABJ7HQ-A99) INJECTION 61%

[Series 2: abd/pel with · axial · 0.65mm/px · z∈[+768,+1112]mm · 13 of 81 slices shown, 15 images]
[im 6/81  soft-tissue]
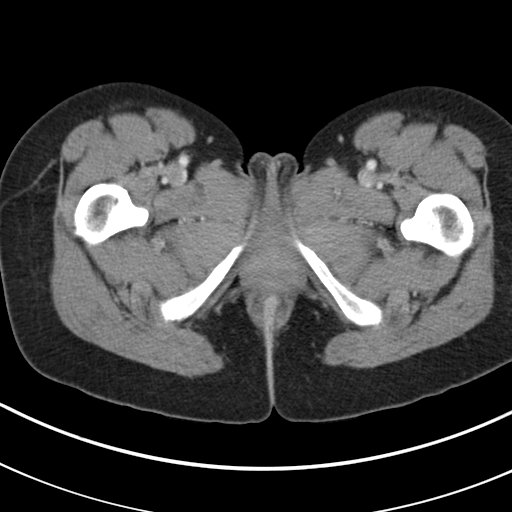
[im 6/81  bone]
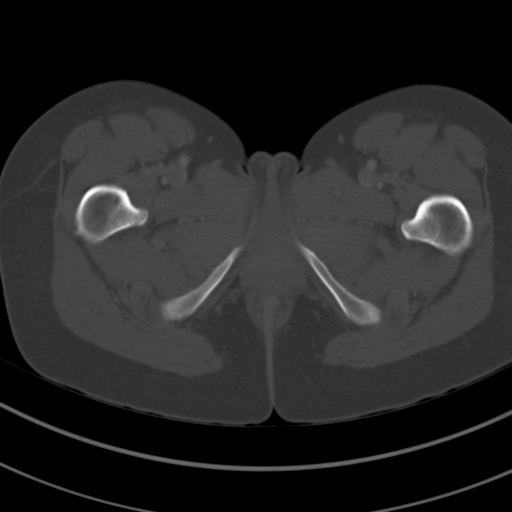
[im 11/81  soft-tissue]
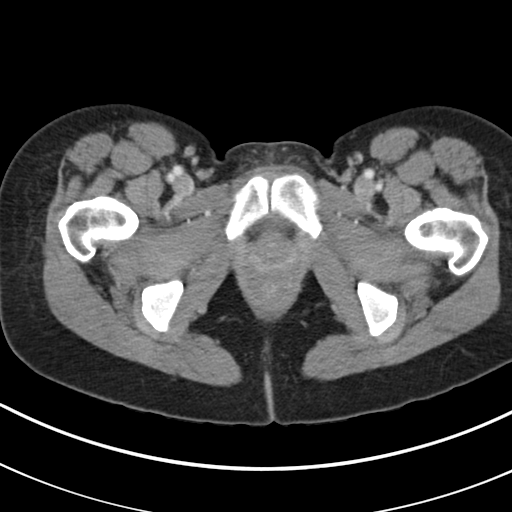
[im 17/81  soft-tissue]
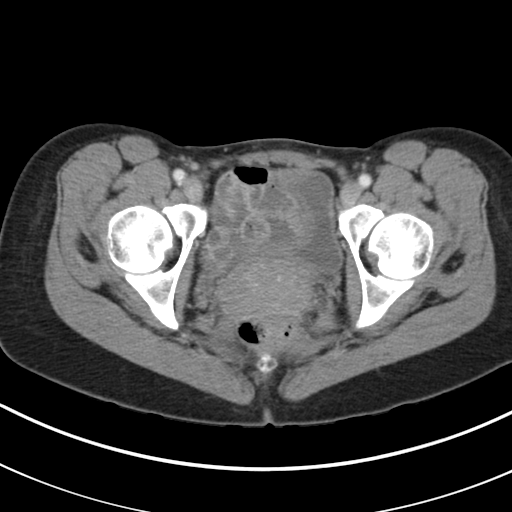
[im 22/81  soft-tissue]
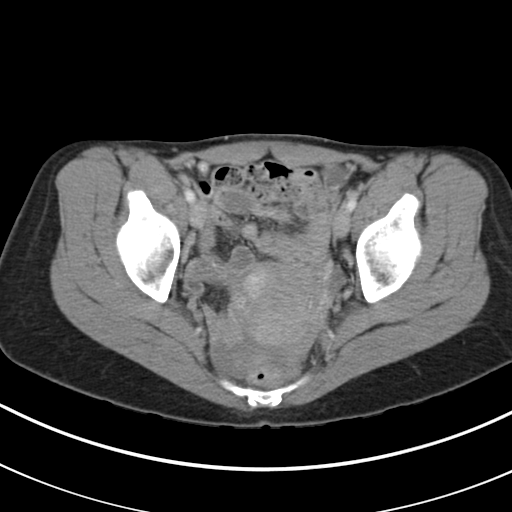
[im 27/81  soft-tissue]
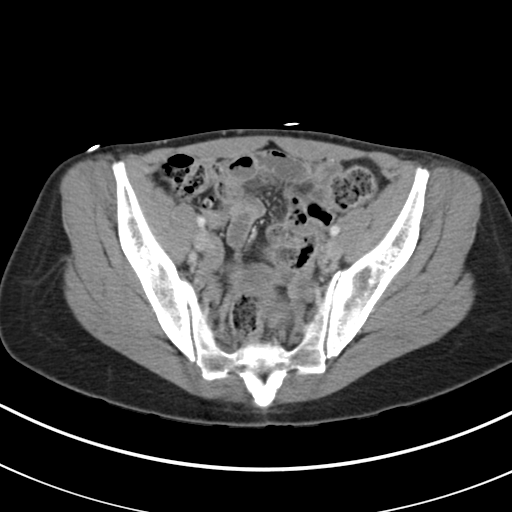
[im 33/81  soft-tissue]
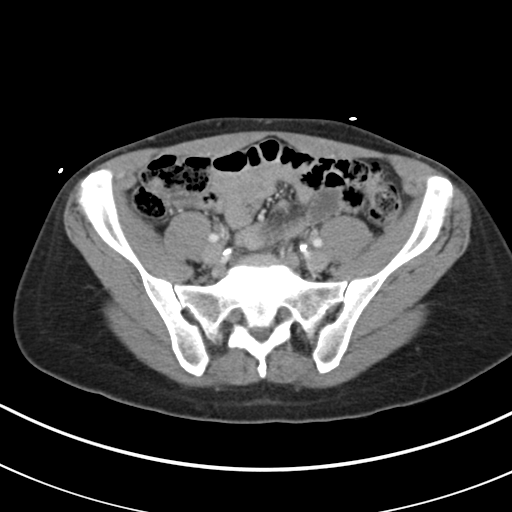
[im 43/81  soft-tissue]
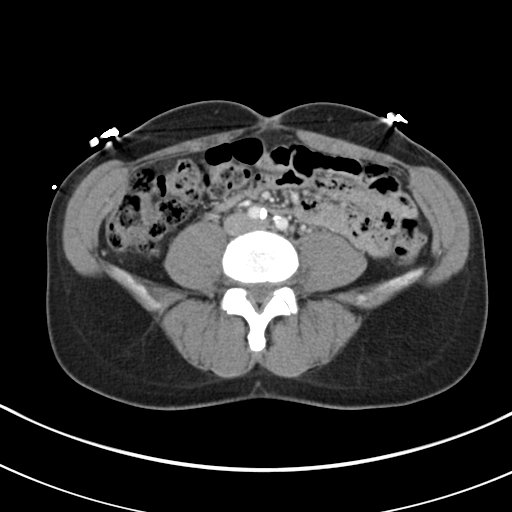
[im 49/81  soft-tissue]
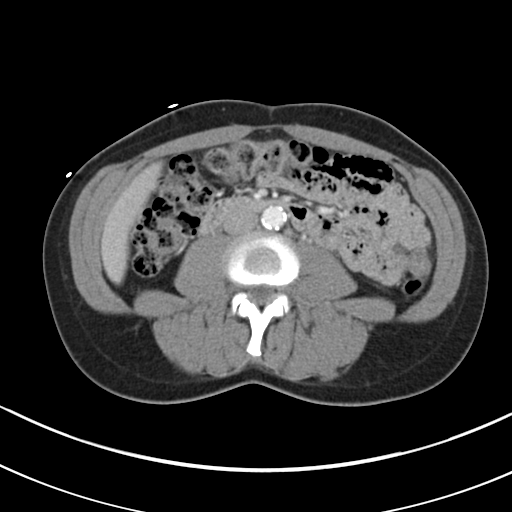
[im 54/81  soft-tissue]
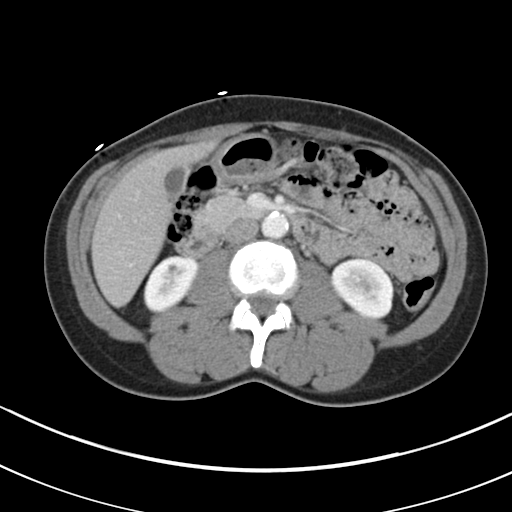
[im 54/81  bone]
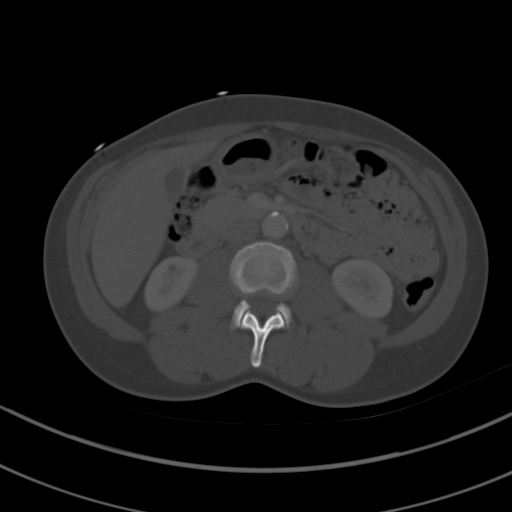
[im 59/81  soft-tissue]
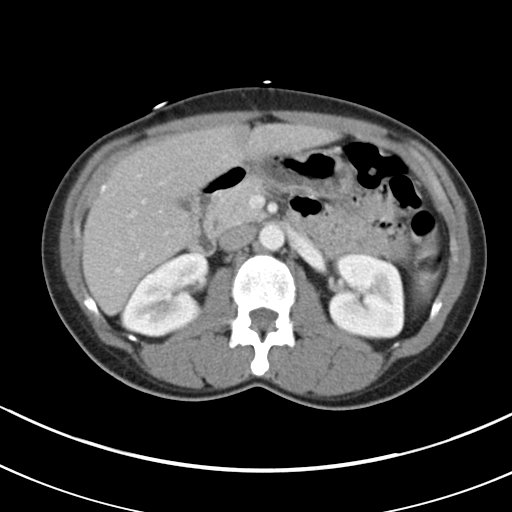
[im 65/81  soft-tissue]
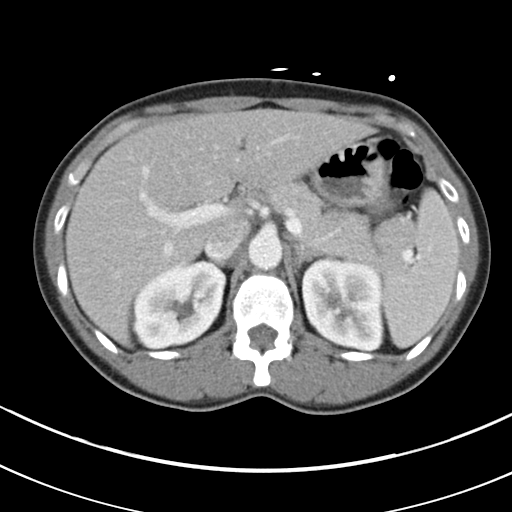
[im 70/81  soft-tissue]
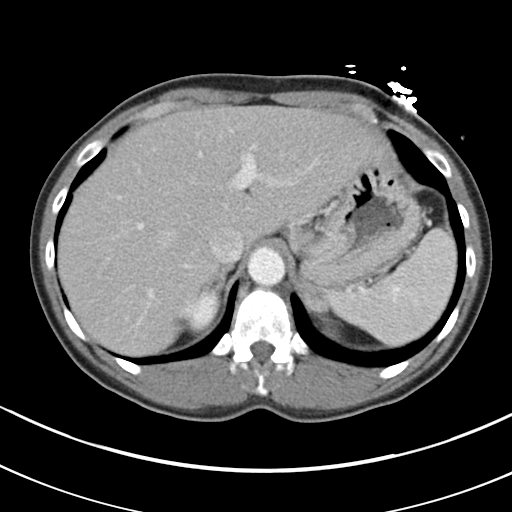
[im 75/81  soft-tissue]
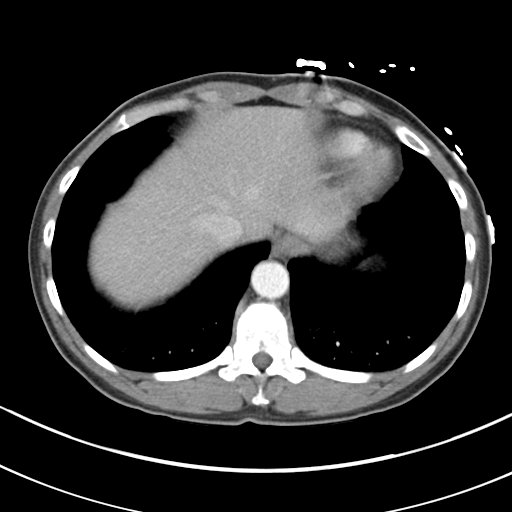

[Series 4: coronal a/|p · coronal · 0.66mm/px · 3 of 130 slices shown]
[im 44/130  soft-tissue]
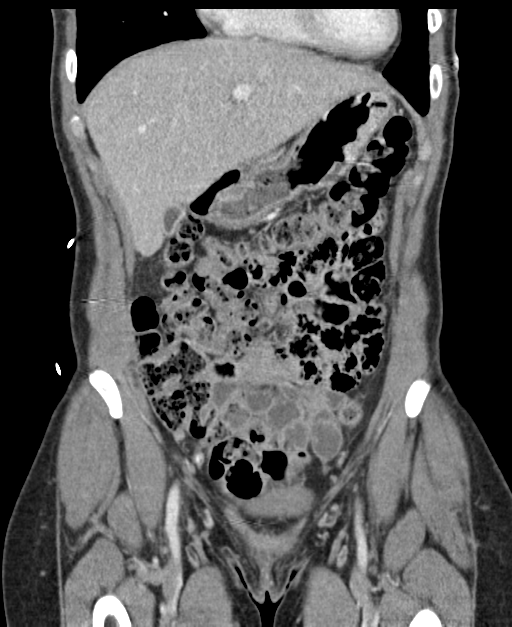
[im 58/130  soft-tissue]
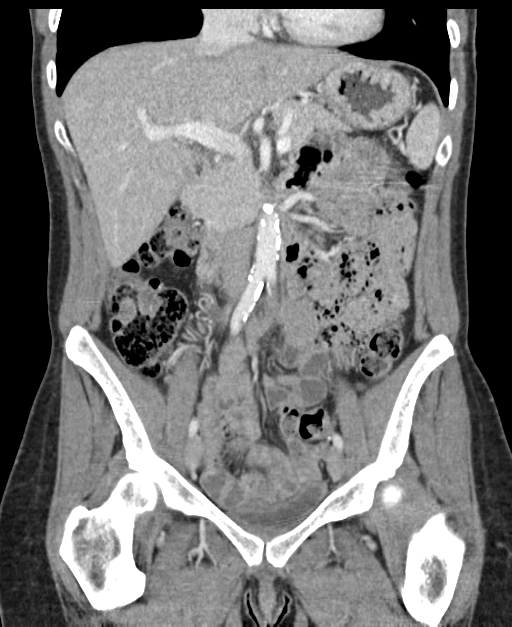
[im 72/130  soft-tissue]
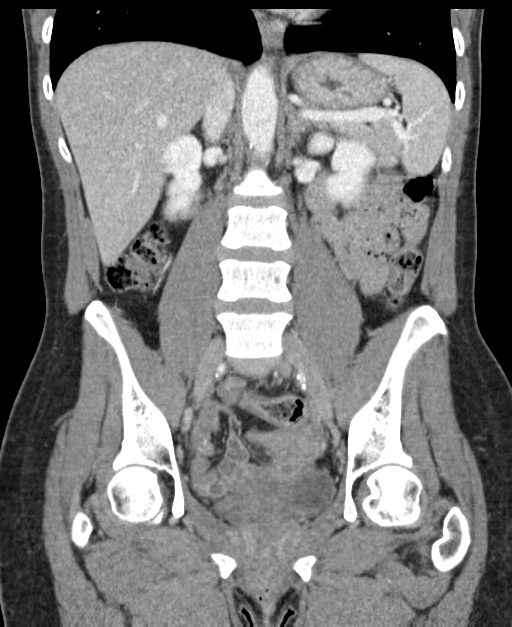

[16 of 46 positions shown; findings below may reference images not displayed]

FINDINGS: Lower chest: No acute abnormality.

Hepatobiliary: No focal liver abnormality is seen. No gallstones,
gallbladder wall thickening, or biliary dilatation. Nonspecific
calcification along the right dome of liver measuring 10 mm (series
2, image 24) appear

Pancreas: Unremarkable. No pancreatic ductal dilatation or
surrounding inflammatory changes.

Spleen: Normal in size without focal abnormality.

Adrenals/Urinary Tract: Adrenal glands are unremarkable. Right
kidney lower pole 8 mm cyst. Kidneys are otherwise normal, without
renal calculi, focal lesion, or hydronephrosis. Bladder is
unremarkable.

Stomach/Bowel: Stomach is within normal limits. Appendix is large in
size but without any evidence for appendicitis, likely normal
variant. No evidence of bowel wall thickening, distention, or
inflammatory changes.

Vascular/Lymphatic: Aortic atherosclerosis. No enlarged abdominal or
pelvic lymph nodes.

Reproductive: Uterine myomas measuring up to 22 mm in the dorsal
myometrium. Retroflexed uterus. Bilateral adnexa are unremarkable.
Trace pelvic fluid is likely physiologic.

Other: No abdominal wall hernia or abnormality. No abdominopelvic
ascites.

Musculoskeletal: No acute or significant osseous findings.
IMPRESSION: 1. No acute process identified as explanation for pain.
2. Aortic atherosclerosis.
3. Myomatous uterus.

By: Blain Jumper M.D.

## 2017-09-20 ENCOUNTER — Encounter (HOSPITAL_BASED_OUTPATIENT_CLINIC_OR_DEPARTMENT_OTHER): Payer: Self-pay | Admitting: *Deleted

## 2017-09-20 ENCOUNTER — Emergency Department (HOSPITAL_BASED_OUTPATIENT_CLINIC_OR_DEPARTMENT_OTHER)
Admission: EM | Admit: 2017-09-20 | Discharge: 2017-09-20 | Disposition: A | Discharge status: Home / Self Care | Payer: BLUE CROSS/BLUE SHIELD | Attending: Emergency Medicine | Admitting: Emergency Medicine

## 2017-09-20 DIAGNOSIS — Y998 Other external cause status: Secondary | ICD-10-CM | POA: Diagnosis not present

## 2017-09-20 DIAGNOSIS — M791 Myalgia, unspecified site: Secondary | ICD-10-CM

## 2017-09-20 DIAGNOSIS — Z79899 Other long term (current) drug therapy: Secondary | ICD-10-CM | POA: Insufficient documentation

## 2017-09-20 DIAGNOSIS — Y939 Activity, unspecified: Secondary | ICD-10-CM | POA: Diagnosis not present

## 2017-09-20 DIAGNOSIS — Z87891 Personal history of nicotine dependence: Secondary | ICD-10-CM | POA: Insufficient documentation

## 2017-09-20 DIAGNOSIS — Y92410 Unspecified street and highway as the place of occurrence of the external cause: Secondary | ICD-10-CM | POA: Diagnosis not present

## 2017-09-20 DIAGNOSIS — Z91040 Latex allergy status: Secondary | ICD-10-CM | POA: Insufficient documentation

## 2017-09-20 MED ORDER — CYCLOBENZAPRINE HCL 10 MG PO TABS: 10.0000 mg | ORAL_TABLET | Freq: Every day | ORAL | 0 refills | Status: AC
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
## 2017-09-20 NOTE — Discharge Instructions (Signed)
You may use over-the-counter Motrin (Ibuprofen), Acetaminophen (Tylenol), topical muscle creams such as SalonPas, Icy Hot, Bengay, etc. Please stretch, apply heat, and have massage therapy for additional assistance. ° °

## 2017-09-20 NOTE — ED Notes (Signed)
ED Provider at bedside. 
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
## 2017-09-22 ENCOUNTER — Telehealth: Payer: Self-pay | Admitting: Hematology and Oncology

## 2017-09-22 NOTE — Telephone Encounter (Signed)
Lvm advising appt chgd from 11/2 to 11/8 @ 11am due to md pal.

## 2017-09-29 ENCOUNTER — Ambulatory Visit: Payer: BLUE CROSS/BLUE SHIELD | Admitting: Hematology and Oncology

## 2017-10-05 ENCOUNTER — Telehealth: Payer: Self-pay | Admitting: Hematology and Oncology

## 2017-10-05 ENCOUNTER — Ambulatory Visit (HOSPITAL_BASED_OUTPATIENT_CLINIC_OR_DEPARTMENT_OTHER): Payer: BLUE CROSS/BLUE SHIELD | Admitting: Hematology and Oncology

## 2017-10-05 DIAGNOSIS — Z170 Estrogen receptor positive status [ER+]: Secondary | ICD-10-CM

## 2017-10-05 DIAGNOSIS — Z79811 Long term (current) use of aromatase inhibitors: Secondary | ICD-10-CM

## 2017-10-05 DIAGNOSIS — Z86000 Personal history of in-situ neoplasm of breast: Secondary | ICD-10-CM | POA: Diagnosis not present

## 2017-10-05 DIAGNOSIS — C50411 Malignant neoplasm of upper-outer quadrant of right female breast: Secondary | ICD-10-CM

## 2017-10-05 NOTE — Progress Notes (Signed)
Patient Care Team: Jilda Panda, MD as PCP - General (Internal Medicine) Eppie Gibson, MD as Attending Physician (Radiation Oncology) Nicholas Lose, MD as Consulting Physician (Hematology and Oncology) Delice Bison Charlestine Massed, NP as Nurse Practitioner (Hematology and Oncology) Jovita Kussmaul, MD as Consulting Physician (General Surgery)  DIAGNOSIS:  Encounter Diagnosis  Name Primary?  . Carcinoma of upper-outer quadrant of right breast in female, estrogen receptor positive ([place])     SUMMARY OF ONCOLOGIC HISTORY:   Carcinoma of upper-outer quadrant of right breast in female, estrogen receptor positive ([place])   06/20/2016 Initial Diagnosis    Right breast biopsy: Focal atypical ductal hyperplasia with fibrocystic changes      08/11/2016 Surgery    Right lumpectomy: Low-grade DCIS with calcifications, all margins are negative,size 0.5 cm,ER 100%, PR 70%, Tis N0 stage 0      09/21/2016 - 10/18/2016 Radiation Therapy    Adj XRT      11/15/2016 -  Anti-estrogen oral therapy    Tamoxifen 20 mg daily switched to anastrozole 04/04/2017 when she became menopausal       CHIEF COMPLIANT: Follow-up on anastrozole therapy  INTERVAL HISTORY: Cassidy Mayer is a 51 year old with above-mentioned history of right breast cancer currently on anastrozole therapy and appears to be tolerating extremely well.  She had a lot of hot flashes which have resolved with the gabapentin.  She denies any arthralgias or myalgias.  She denies any lumps or nodules in the breast.   REVIEW OF SYSTEMS:   Constitutional: Denies fevers, chills or abnormal weight loss Eyes: Denies blurriness of vision Ears, nose, mouth, throat, and face: Denies mucositis or sore throat Respiratory: Denies cough, dyspnea or wheezes Cardiovascular: Denies palpitation, chest discomfort Gastrointestinal:  Denies nausea, heartburn or change in bowel habits Skin: Denies abnormal skin rashes Lymphatics: Denies new lymphadenopathy  or easy bruising Neurological:Denies numbness, tingling or new weaknesses Behavioral/Psych: Mood is stable, no new changes  Extremities: No lower extremity edema Breast:  denies any pain or lumps or nodules in either breasts All other systems were reviewed with the patient and are negative.  I have reviewed the past medical history, past surgical history, social history and family history with the patient and they are unchanged from previous note.  ALLERGIES:  is allergic to latex.  MEDICATIONS:  Current Outpatient Medications  Medication Sig Dispense Refill  . acetaminophen (TYLENOL) 500 MG tablet Take 1,000 mg by mouth every 6 (six) hours as needed (pain).    Marland Kitchen anastrozole (ARIMIDEX) 1 MG tablet Take 1 tablet (1 mg total) by mouth daily. 90 tablet 3  . Calcium Carb-Cholecalciferol (CALCIUM-VITAMIN D) 600-400 MG-UNIT TABS Take by mouth.    . Cholecalciferol (VITAMIN D3) 2000 units TABS Take 1,000 Units by mouth daily. 30 tablet   . CLOBETASOL PROPIONATE EX Apply 1 application topically every other day.     . esomeprazole (NEXIUM) 40 MG capsule Take 40 mg by mouth daily at 12 noon.    . gabapentin (NEURONTIN) 600 MG tablet Take 600 mg by mouth at bedtime.    Marland Kitchen ibuprofen (ADVIL,MOTRIN) 200 MG tablet Take 200 mg by mouth every 6 (six) hours as needed (pain).    . magnesium 30 MG tablet Take 30 mg by mouth 3 (three) times a week.     . Turmeric 500 MG CAPS Take 500 mg by mouth daily.    Marland Kitchen UNABLE TO FIND Med Name: Dermasmooth, spray for hair. Used every other night    . vitamin C (ASCORBIC  ACID) 500 MG tablet Take 500 mg by mouth daily.     No current facility-administered medications for this visit.     PHYSICAL EXAMINATION: ECOG PERFORMANCE STATUS: 1 - Symptomatic but completely ambulatory  Vitals reviewed  GENERAL:alert, no distress and comfortable SKIN: skin color, texture, turgor are normal, no rashes or significant lesions EYES: normal, Conjunctiva are pink and non-injected,  sclera clear OROPHARYNX:no exudate, no erythema and lips, buccal mucosa, and tongue normal  NECK: supple, thyroid normal size, non-tender, without nodularity LYMPH:  no palpable lymphadenopathy in the cervical, axillary or inguinal LUNGS: clear to auscultation and percussion with normal breathing effort HEART: regular rate & rhythm and no murmurs and no lower extremity edema ABDOMEN:abdomen soft, non-tender and normal bowel sounds MUSCULOSKELETAL:no cyanosis of digits and no clubbing  NEURO: alert & oriented x 3 with fluent speech, no focal motor/sensory deficits EXTREMITIES: No lower extremity edema BREAST: No palpable masses or nodules in either right or left breasts. No palpable axillary supraclavicular or infraclavicular adenopathy no breast tenderness or nipple discharge. (exam performed in the presence of a chaperone)  LABORATORY DATA:  I have reviewed the data as listed   Chemistry      Component Value Date/Time   NA 137 10/17/2016 1756   K 3.8 10/17/2016 1756   CL 107 10/17/2016 1756   CO2 23 10/17/2016 1756   BUN 11 10/17/2016 1756   CREATININE 0.70 10/17/2016 1756      Component Value Date/Time   CALCIUM 9.3 10/17/2016 1756   ALKPHOS 25 (L) 10/17/2016 1756   AST 22 10/17/2016 1756   ALT 22 10/17/2016 1756   BILITOT 0.7 10/17/2016 1756       Lab Results  Component Value Date   WBC 4.6 10/17/2016   HGB 12.2 10/17/2016   HCT 36.3 10/17/2016   MCV 82.9 10/17/2016   PLT 124 (L) 10/17/2016    ASSESSMENT & PLAN:  Carcinoma of upper-outer quadrant of right breast in female, estrogen receptor positive ([place]) Right lumpectomy 06/20/2016: Low-grade DCIS with calcifications, all margins are negative,size 0.5 cm,ER 100%, PR 70%, Tis N0 stage 0 Adj XRT 10/25 to 10/18/16  Treatment: Adj Tamoxifen 20 mg daily Started December 2017 Switched to anastrozole 04/04/2017 once she was determined to be menopausal  Anastrozole toxicities: No side effects to anastrozole.  Hot  flashes which she had previously had subsided by gabapentin.  She is  Breast cancer surveillance: 1.  Breast exam 10/05/2017: Benign  2. mammogram 06/12/2017: Benign, breast density category C 3.  Bone density 06/12/2017: T score -1.2  Return to clinic in 1 year for follow-up  I spent 25 minutes talking to the patient of which more than half was spent in counseling and coordination of care.  No orders of the defined types were placed in this encounter.  The patient has a good understanding of the overall plan. she agrees with it. she will call with any problems that may develop before the next visit here.   Rulon Eisenmenger, MD 10/05/17

## 2017-10-05 NOTE — Telephone Encounter (Signed)
Gave patient avs report and appointments for November 2019 °

## 2017-10-05 NOTE — Assessment & Plan Note (Signed)
Right lumpectomy 06/20/2016: Low-grade DCIS with calcifications, all margins are negative,size 0.5 cm,ER 100%, PR 70%, Tis N0 stage 0 Adj XRT 10/25 to 10/18/16  Treatment: Adj Tamoxifen 20 mg daily Started December 2017 Switched to anastrozole 04/04/2017 once she was determined to be menopausal  Anastrozole toxicities:  Breast cancer surveillance: 1.  Breast exam 10/05/2017: Benign  2. mammogram 06/12/2017: Benign, breast density category C 3.  Bone density 06/12/2017: T score -1.2  Return to clinic in 1 year for follow-up Return to clinic in 1 year for follow-up

## 2017-10-06 ENCOUNTER — Ambulatory Visit: Payer: BLUE CROSS/BLUE SHIELD | Admitting: Hematology and Oncology

## 2018-03-08 ENCOUNTER — Other Ambulatory Visit: Payer: Self-pay | Admitting: General Surgery

## 2018-03-08 DIAGNOSIS — Z853 Personal history of malignant neoplasm of breast: Secondary | ICD-10-CM

## 2018-03-23 ENCOUNTER — Other Ambulatory Visit: Payer: Self-pay | Admitting: Hematology and Oncology

## 2018-06-13 ENCOUNTER — Ambulatory Visit
Admission: RE | Admit: 2018-06-13 | Discharge: 2018-06-13 | Disposition: A | Discharge status: Home / Self Care | Payer: BLUE CROSS/BLUE SHIELD | Source: Ambulatory Visit | Attending: General Surgery | Admitting: General Surgery

## 2018-06-13 DIAGNOSIS — Z853 Personal history of malignant neoplasm of breast: Secondary | ICD-10-CM

## 2018-06-18 ENCOUNTER — Other Ambulatory Visit: Payer: Self-pay | Admitting: Hematology and Oncology

## 2018-09-05 ENCOUNTER — Telehealth: Payer: Self-pay | Admitting: Hematology and Oncology

## 2018-09-05 NOTE — Telephone Encounter (Signed)
VG PAL 11/8 - moved f/u to 11/12. Left message. Schedule mailed.

## 2018-09-14 ENCOUNTER — Other Ambulatory Visit: Payer: Self-pay | Admitting: Hematology and Oncology

## 2018-10-05 ENCOUNTER — Ambulatory Visit: Payer: BLUE CROSS/BLUE SHIELD | Admitting: Hematology and Oncology

## 2018-10-09 ENCOUNTER — Inpatient Hospital Stay: Payer: BLUE CROSS/BLUE SHIELD | Attending: Hematology and Oncology | Admitting: Hematology and Oncology

## 2018-10-09 DIAGNOSIS — Z170 Estrogen receptor positive status [ER+]: Secondary | ICD-10-CM | POA: Insufficient documentation

## 2018-10-09 DIAGNOSIS — D0591 Unspecified type of carcinoma in situ of right breast: Secondary | ICD-10-CM | POA: Insufficient documentation

## 2018-10-09 DIAGNOSIS — Z79899 Other long term (current) drug therapy: Secondary | ICD-10-CM | POA: Insufficient documentation

## 2018-10-09 DIAGNOSIS — Z79811 Long term (current) use of aromatase inhibitors: Secondary | ICD-10-CM | POA: Insufficient documentation

## 2018-10-09 DIAGNOSIS — Z923 Personal history of irradiation: Secondary | ICD-10-CM | POA: Insufficient documentation

## 2018-10-09 NOTE — Assessment & Plan Note (Deleted)
Right lumpectomy 06/20/2016: Low-grade DCIS with calcifications, all margins are negative,size 0.5 cm,ER 100%, PR 70%, Tis N0 stage 0 Adj XRT 10/25 to 10/18/16  Treatment: Adj Tamoxifen 20 mg daily Started December 2017Switched to anastrozole 04/04/2017 once she was determined to be menopausal  Anastrozole toxicities: No side effects to anastrozole.  Hot flashes which she had previously had subsided by gabapentin.  She is  Breast cancer surveillance: 1.  Breast exam 10/09/18: Benign  2. mammogram 06/13/18: Benign, breast density category C 3.  Bone density 06/12/2017: T score -1.2  Return to clinic in 1 year for follow-up

## 2018-10-11 ENCOUNTER — Inpatient Hospital Stay (HOSPITAL_BASED_OUTPATIENT_CLINIC_OR_DEPARTMENT_OTHER): Payer: BLUE CROSS/BLUE SHIELD | Admitting: Hematology and Oncology

## 2018-10-11 ENCOUNTER — Telehealth: Payer: Self-pay | Admitting: Hematology and Oncology

## 2018-10-11 DIAGNOSIS — C50411 Malignant neoplasm of upper-outer quadrant of right female breast: Secondary | ICD-10-CM

## 2018-10-11 DIAGNOSIS — D0591 Unspecified type of carcinoma in situ of right breast: Secondary | ICD-10-CM | POA: Diagnosis present

## 2018-10-11 DIAGNOSIS — Z79811 Long term (current) use of aromatase inhibitors: Secondary | ICD-10-CM | POA: Diagnosis not present

## 2018-10-11 DIAGNOSIS — Z79899 Other long term (current) drug therapy: Secondary | ICD-10-CM | POA: Diagnosis not present

## 2018-10-11 DIAGNOSIS — Z923 Personal history of irradiation: Secondary | ICD-10-CM | POA: Diagnosis not present

## 2018-10-11 DIAGNOSIS — Z170 Estrogen receptor positive status [ER+]: Secondary | ICD-10-CM

## 2018-10-11 MED ORDER — ANASTROZOLE 1 MG PO TABS: 1.0000 mg | ORAL_TABLET | Freq: Every day | ORAL | 3 refills | Status: AC

## 2018-10-11 NOTE — Telephone Encounter (Signed)
Printed Calendar and avs. °

## 2018-10-11 NOTE — Assessment & Plan Note (Signed)
Right lumpectomy 06/20/2016: Low-grade DCIS with calcifications, all margins are negative,size 0.5 cm,ER 100%, PR 70%, Tis N0 stage 0 Adj XRT 10/25 to 10/18/16  Treatment: Adj Tamoxifen 20 mg daily Started December 2017Switched to anastrozole 04/04/2017 once she was determined to be menopausal  Anastrozole toxicities: No side effects to anastrozole.  Hot flashes which she had previously had subsided by gabapentin.  Breast cancer surveillance: 1.  Breast exam 10/11/2018: Benign  2. mammogram 06/13/2018: Benign, breast density category C 3.  Bone density 06/12/2017: T score -1.2  Return to clinic in 1 year for follow-up

## 2018-10-11 NOTE — Progress Notes (Signed)
Patient Care Team: Jilda Panda, MD as PCP - General (Internal Medicine) Eppie Gibson, MD as Attending Physician (Radiation Oncology) Nicholas Lose, MD as Consulting Physician (Hematology and Oncology) Delice Bison Charlestine Massed, NP as Nurse Practitioner (Hematology and Oncology) Jovita Kussmaul, MD as Consulting Physician (General Surgery)  DIAGNOSIS:  Encounter Diagnosis  Name Primary?  . Carcinoma of upper-outer quadrant of right breast in female, estrogen receptor positive ([place])     SUMMARY OF ONCOLOGIC HISTORY:   Carcinoma of upper-outer quadrant of right breast in female, estrogen receptor positive ([place])   06/20/2016 Initial Diagnosis    Right breast biopsy: Focal atypical ductal hyperplasia with fibrocystic changes    08/11/2016 Surgery    Right lumpectomy: Low-grade DCIS with calcifications, all margins are negative,size 0.5 cm,ER 100%, PR 70%, Tis N0 stage 0    09/21/2016 - 10/18/2016 Radiation Therapy    Adj XRT    11/15/2016 -  Anti-estrogen oral therapy    Tamoxifen 20 mg daily switched to anastrozole 04/04/2017 when she became menopausal     CHIEF COMPLIANT: Follow-up on anastrozole therapy  INTERVAL HISTORY: Cassidy Mayer is a 52 year old with above-mentioned history of right breast cancer is currently on anastrozole and appears to be tolerating extremely well.  She denies any lumps or nodules.  She has scar tissue from prior surgery which is stable.  Mammograms done in July were normal.  REVIEW OF SYSTEMS:   Constitutional: Denies fevers, chills or abnormal weight loss Eyes: Denies blurriness of vision Ears, nose, mouth, throat, and face: Denies mucositis or sore throat Respiratory: Denies cough, dyspnea or wheezes Cardiovascular: Denies palpitation, chest discomfort Gastrointestinal:  Denies nausea, heartburn or change in bowel habits Skin: Denies abnormal skin rashes Lymphatics: Denies new lymphadenopathy or easy bruising Neurological:Denies numbness,  tingling or new weaknesses Behavioral/Psych: Mood is stable, no new changes  Extremities: No lower extremity edema Breast: Slight nodularity along the scar tissue right breast All other systems were reviewed with the patient and are negative.  I have reviewed the past medical history, past surgical history, social history and family history with the patient and they are unchanged from previous note.  ALLERGIES:  is allergic to latex.  MEDICATIONS:  Current Outpatient Medications  Medication Sig Dispense Refill  . acetaminophen (TYLENOL) 500 MG tablet Take 1,000 mg by mouth every 6 (six) hours as needed (pain).    Marland Kitchen anastrozole (ARIMIDEX) 1 MG tablet Take 1 tablet (1 mg total) by mouth daily. 90 tablet 3  . Calcium Carb-Cholecalciferol (CALCIUM-VITAMIN D) 600-400 MG-UNIT TABS Take by mouth.    . Cholecalciferol (VITAMIN D3) 2000 units TABS Take 1,000 Units by mouth daily. 30 tablet   . CLOBETASOL PROPIONATE EX Apply 1 application topically every other day.     . gabapentin (NEURONTIN) 600 MG tablet Take 600 mg by mouth at bedtime.    Marland Kitchen ibuprofen (ADVIL,MOTRIN) 200 MG tablet Take 200 mg by mouth every 6 (six) hours as needed (pain).    . magnesium 30 MG tablet Take 30 mg by mouth 3 (three) times a week.     . Turmeric 500 MG CAPS Take 500 mg by mouth daily.    Marland Kitchen UNABLE TO FIND Med Name: Dermasmooth, spray for hair. Used every other night     No current facility-administered medications for this visit.     PHYSICAL EXAMINATION: ECOG PERFORMANCE STATUS: 1 - Symptomatic but completely ambulatory  Vitals:   10/11/18 1332  BP: 120/73  Pulse: 83  Resp: 18  Temp:  97.8 F (36.6 C)  SpO2: 100%   Filed Weights   10/11/18 1332  Weight: 152 lb 6.4 oz (69.1 kg)    GENERAL:alert, no distress and comfortable SKIN: skin color, texture, turgor are normal, no rashes or significant lesions EYES: normal, Conjunctiva are pink and non-injected, sclera clear OROPHARYNX:no exudate, no erythema  and lips, buccal mucosa, and tongue normal  NECK: supple, thyroid normal size, non-tender, without nodularity LYMPH:  no palpable lymphadenopathy in the cervical, axillary or inguinal LUNGS: clear to auscultation and percussion with normal breathing effort HEART: regular rate & rhythm and no murmurs and no lower extremity edema ABDOMEN:abdomen soft, non-tender and normal bowel sounds MUSCULOSKELETAL:no cyanosis of digits and no clubbing  NEURO: alert & oriented x 3 with fluent speech, no focal motor/sensory deficits EXTREMITIES: No lower extremity edema   LABORATORY DATA:  I have reviewed the data as listed CMP Latest Ref Rng & Units 10/17/2016 01/07/2016 12/25/2014  Glucose 65 - 99 mg/dL 90 93 91  BUN 6 - 20 mg/dL 11 <5(L) 20  Creatinine 0.44 - 1.00 mg/dL 0.70 0.64 0.84  Sodium 135 - 145 mmol/L 137 139 133(L)  Potassium 3.5 - 5.1 mmol/L 3.8 3.8 3.6  Chloride 101 - 111 mmol/L 107 106 106  CO2 22 - 32 mmol/L 23 22 23   Calcium 8.9 - 10.3 mg/dL 9.3 9.5 9.3  Total Protein 6.5 - 8.1 g/dL 7.8 - -  Total Bilirubin 0.3 - 1.2 mg/dL 0.7 - -  Alkaline Phos 38 - 126 U/L 25(L) - -  AST 15 - 41 U/L 22 - -  ALT 14 - 54 U/L 22 - -    Lab Results  Component Value Date   WBC 4.6 10/17/2016   HGB 12.2 10/17/2016   HCT 36.3 10/17/2016   MCV 82.9 10/17/2016   PLT 124 (L) 10/17/2016    ASSESSMENT & PLAN:  Carcinoma of upper-outer quadrant of right breast in female, estrogen receptor positive ([place]) Right lumpectomy 06/20/2016: Low-grade DCIS with calcifications, all margins are negative,size 0.5 cm,ER 100%, PR 70%, Tis N0 stage 0 Adj XRT 10/25 to 10/18/16  Treatment: Adj Tamoxifen 20 mg daily Started December 2017Switched to anastrozole 04/04/2017 once she was determined to be menopausal  Anastrozole toxicities: No side effects to anastrozole.  Hot flashes which she had previously had subsided by gabapentin.  Breast cancer surveillance: 1.  Breast exam 10/11/2018: Benign  2. mammogram   06/13/2018: Benign, breast density category C 3.  Bone density 06/12/2017: T score -1.2  Her niece had breast cancer age 13.  She was diagnosed with her breast cancer before the age of 11.  So we recommended genetic testing.  She will be referred for genetic counseling and testing. Patient works as a Technical brewer for [employer].  She is planning to change to [facility] in the coming month. Return to clinic in 1 year for follow-up    No orders of the defined types were placed in this encounter.  The patient has a good understanding of the overall plan. she agrees with it. she will call with any problems that may develop before the next visit here.   Harriette Ohara, MD 10/11/18

## 2018-12-24 ENCOUNTER — Inpatient Hospital Stay: Payer: BLUE CROSS/BLUE SHIELD | Admitting: Genetics

## 2019-01-04 ENCOUNTER — Telehealth: Payer: Self-pay | Admitting: *Deleted

## 2019-01-04 NOTE — Telephone Encounter (Signed)
Medical records faxed to [facility]; RI# [phone]

## 2019-04-04 ENCOUNTER — Other Ambulatory Visit: Payer: Self-pay | Admitting: General Surgery

## 2019-04-04 DIAGNOSIS — Z853 Personal history of malignant neoplasm of breast: Secondary | ICD-10-CM

## 2019-04-08 ENCOUNTER — Other Ambulatory Visit: Payer: Self-pay | Admitting: Physician Assistant

## 2019-04-08 DIAGNOSIS — Z13820 Encounter for screening for osteoporosis: Secondary | ICD-10-CM

## 2019-07-05 ENCOUNTER — Ambulatory Visit
Admission: RE | Admit: 2019-07-05 | Discharge: 2019-07-05 | Disposition: A | Discharge status: Home / Self Care | Payer: PRIVATE HEALTH INSURANCE | Source: Ambulatory Visit | Attending: General Surgery | Admitting: General Surgery

## 2019-07-05 ENCOUNTER — Ambulatory Visit
Admission: RE | Admit: 2019-07-05 | Discharge: 2019-07-05 | Disposition: A | Discharge status: Home / Self Care | Payer: PRIVATE HEALTH INSURANCE | Source: Ambulatory Visit | Attending: Physician Assistant | Admitting: Physician Assistant

## 2019-07-05 ENCOUNTER — Other Ambulatory Visit: Payer: Self-pay

## 2019-07-05 DIAGNOSIS — Z13820 Encounter for screening for osteoporosis: Secondary | ICD-10-CM

## 2019-07-05 DIAGNOSIS — Z853 Personal history of malignant neoplasm of breast: Secondary | ICD-10-CM

## 2019-10-14 ENCOUNTER — Inpatient Hospital Stay: Payer: PRIVATE HEALTH INSURANCE | Attending: Hematology and Oncology | Admitting: Hematology and Oncology

## 2019-10-14 NOTE — Assessment & Plan Note (Deleted)
Right lumpectomy 06/20/2016: Low-grade DCIS with calcifications, all margins are negative,size 0.5 cm,ER 100%, PR 70%, Tis N0 stage 0 Adj XRT 10/25 to 10/18/16  Treatment: Adj Tamoxifen 20 mg daily Started December 2017Switched to anastrozole 04/04/2017 once she was determined to be menopausal  Anastrozole toxicities:No side effects to anastrozole. Hot flashes which she had previously had subsided by gabapentin. 2 more years left on antiestrogen therapy.  Breast cancer surveillance: 1.Breast exam  10/14/2019: Benign  2.mammogram   07/05/2019: Benign, breast density category C 3.Bone density 07/05/2019: T score -1.3  Patient works as a Technical brewer at [employer]. Return to clinic in 1 year for follow-up

## 2020-06-05 ENCOUNTER — Other Ambulatory Visit: Payer: Self-pay | Admitting: Obstetrics and Gynecology

## 2020-06-05 ENCOUNTER — Other Ambulatory Visit: Payer: Self-pay | Admitting: Physician Assistant

## 2020-06-05 DIAGNOSIS — Z9889 Other specified postprocedural states: Secondary | ICD-10-CM

## 2020-07-06 ENCOUNTER — Other Ambulatory Visit: Payer: Self-pay

## 2020-07-06 ENCOUNTER — Ambulatory Visit
Admission: RE | Admit: 2020-07-06 | Discharge: 2020-07-06 | Disposition: A | Discharge status: Home / Self Care | Payer: PRIVATE HEALTH INSURANCE | Source: Ambulatory Visit | Attending: Physician Assistant | Admitting: Physician Assistant

## 2020-07-06 DIAGNOSIS — Z9889 Other specified postprocedural states: Secondary | ICD-10-CM

## 2021-02-16 ENCOUNTER — Other Ambulatory Visit: Payer: Self-pay | Admitting: Physician Assistant

## 2021-02-16 DIAGNOSIS — C50411 Malignant neoplasm of upper-outer quadrant of right female breast: Secondary | ICD-10-CM

## 2021-02-16 DIAGNOSIS — Z170 Estrogen receptor positive status [ER+]: Secondary | ICD-10-CM

## 2021-02-26 ENCOUNTER — Other Ambulatory Visit: Payer: Self-pay | Admitting: Physician Assistant

## 2021-02-26 DIAGNOSIS — Z9889 Other specified postprocedural states: Secondary | ICD-10-CM

## 2021-02-26 DIAGNOSIS — Z8739 Personal history of other diseases of the musculoskeletal system and connective tissue: Secondary | ICD-10-CM

## 2021-07-02 ENCOUNTER — Other Ambulatory Visit: Payer: Self-pay | Admitting: Physician Assistant

## 2021-07-02 DIAGNOSIS — Z853 Personal history of malignant neoplasm of breast: Secondary | ICD-10-CM

## 2021-07-07 ENCOUNTER — Other Ambulatory Visit: Payer: Self-pay | Admitting: Oncology

## 2021-07-07 ENCOUNTER — Other Ambulatory Visit: Payer: Self-pay

## 2021-07-07 ENCOUNTER — Ambulatory Visit
Admission: RE | Admit: 2021-07-07 | Discharge: 2021-07-07 | Disposition: A | Discharge status: Home / Self Care | Payer: No Typology Code available for payment source | Source: Ambulatory Visit | Attending: Physician Assistant | Admitting: Physician Assistant

## 2021-07-07 DIAGNOSIS — Z853 Personal history of malignant neoplasm of breast: Secondary | ICD-10-CM

## 2021-08-11 ENCOUNTER — Ambulatory Visit
Admission: RE | Admit: 2021-08-11 | Discharge: 2021-08-11 | Disposition: A | Discharge status: Home / Self Care | Payer: No Typology Code available for payment source | Source: Ambulatory Visit | Attending: Physician Assistant | Admitting: Physician Assistant

## 2021-08-11 ENCOUNTER — Other Ambulatory Visit: Payer: Self-pay

## 2021-08-11 DIAGNOSIS — C50411 Malignant neoplasm of upper-outer quadrant of right female breast: Secondary | ICD-10-CM

## 2021-08-11 DIAGNOSIS — Z170 Estrogen receptor positive status [ER+]: Secondary | ICD-10-CM

## 2024-06-16 ENCOUNTER — Ambulatory Visit
Admission: EM | Admit: 2024-06-16 | Discharge: 2024-06-16 | Disposition: A | Discharge status: Home / Self Care | Attending: Family Medicine | Admitting: Family Medicine

## 2024-06-16 DIAGNOSIS — J4530 Mild persistent asthma, uncomplicated: Secondary | ICD-10-CM

## 2024-06-16 DIAGNOSIS — J988 Other specified respiratory disorders: Secondary | ICD-10-CM

## 2024-06-16 DIAGNOSIS — B9789 Other viral agents as the cause of diseases classified elsewhere: Secondary | ICD-10-CM

## 2024-06-16 MED ORDER — PREDNISONE 20 MG PO TABS: ORAL_TABLET | ORAL | 0 refills | Status: DC

## 2024-06-16 MED ORDER — PROMETHAZINE-DM 6.25-15 MG/5ML PO SYRP: 5.0000 mL | ORAL_SOLUTION | Freq: Three times a day (TID) | ORAL | 0 refills | Status: DC | PRN

## 2024-06-16 NOTE — ED Provider Notes (Signed)
 [facility]  Note:  This document was prepared using Conservation officer, historic buildings and may include unintentional dictation errors.  MRN: [id number] DOB: 03/25/1966  Subjective:   Cassidy Mayer is a 58 y.o. female presenting for 3 day history of acute onset persistent bothersome cough that is disrupting her sleep. No fever, chest pain, shob, wheezing, body aches, rashes. No smoking of any kind including cigarettes, cigars, vaping, marijuana use.  Was a former smoker. Has a history of reactive airway, has been using her albuterol inhaler with some relief. Typically does well with a steroid course. Did a COVID test at home and was negative.   No current facility-administered medications for this encounter.  Current Outpatient Medications:    albuterol (VENTOLIN HFA) 108 (90 Base) MCG/ACT inhaler, Inhale 2 puffs into the lungs., Disp: , Rfl:    amitriptyline (ELAVIL) 25 MG tablet, Take 75 mg by mouth., Disp: , Rfl:    Calcium Carbonate-Vit D-Min (CALCIUM 600+D PLUS MINERALS) 600-400 MG-UNIT TABS, Take by mouth., Disp: , Rfl:    dextromethorphan-guaiFENesin (MUCINEX DM) 30-600 MG 12hr tablet, Take 1 tablet by mouth 2 (two) times daily., Disp: , Rfl:    guaifenesin (ROBITUSSIN) 100 MG/5ML syrup, Take 200 mg by mouth 3 (three) times daily as needed for cough., Disp: , Rfl:    acetaminophen  (TYLENOL ) 500 MG tablet, Take 1,000 mg by mouth every 6 (six) hours as needed (pain)., Disp: , Rfl:    amLODipine (NORVASC) 5 MG tablet, Take 5 mg by mouth daily., Disp: , Rfl:    anastrozole  (ARIMIDEX ) 1 MG tablet, Take 1 tablet (1 mg total) by mouth daily., Disp: 90 tablet, Rfl: 3   Calcium Carb-Cholecalciferol (CALCIUM-VITAMIN D) 600-400 MG-UNIT TABS, Take by mouth., Disp: , Rfl:    Cholecalciferol (VITAMIN D3) 2000 units TABS, Take 1,000 Units by mouth daily., Disp: 30 tablet, Rfl:    CLOBETASOL PROPIONATE EX, Apply 1 application topically every other day. , Disp: , Rfl:     gabapentin (NEURONTIN) 600 MG tablet, Take 600 mg by mouth at bedtime., Disp: , Rfl:    ibuprofen (ADVIL,MOTRIN) 200 MG tablet, Take 200 mg by mouth every 6 (six) hours as needed (pain)., Disp: , Rfl:    magnesium 30 MG tablet, Take 30 mg by mouth 3 (three) times a week. , Disp: , Rfl:    Turmeric 500 MG CAPS, Take 500 mg by mouth daily., Disp: , Rfl:    UNABLE TO FIND, Med Name: Dermasmooth, spray for hair. Used every other night, Disp: , Rfl:    Allergies  Allergen Reactions   Latex Swelling and Rash    Past Medical History:  Diagnosis Date   Anxiety    Breast mass, right    Eczema    History of radiation therapy 09/21/16- 10/18/16   Right Breast 40.05 Gy in 15 fractions. Right Breast boost of 5 fractions.    Personal history of radiation therapy 10/17-11/17     Past Surgical History:  Procedure Laterality Date   BREAST BIOPSY Right 06/20/2016   malignant   BREAST LUMPECTOMY Right 08/11/2016   BREAST LUMPECTOMY WITH RADIOACTIVE SEED LOCALIZATION Right 08/11/2016   Procedure: RIGHT BREAST LUMPECTOMY WITH RADIOACTIVE SEED LOCALIZATION;  Surgeon: Deward Null III, MD;  Location: [facility];  Service: General;  Laterality: Right;   ELBOW SURGERY     TUBAL LIGATION      Family History  Problem Relation Age of Onset   Breast cancer Neg Hx  Colon cancer Neg Hx    Prostate cancer Neg Hx    Lung cancer Neg Hx     Social History   Tobacco Use   Smoking status: Former    Current packs/day: 0.25    Types: Cigars, Cigarettes   Smokeless tobacco: Never   Tobacco comments:    smokes black and milds  Vaping Use   Vaping status: Never Used  Substance Use Topics   Alcohol use: No   Drug use: No    ROS   Objective:   Vitals: BP (!) 153/88 (BP Location: Right Arm)   Pulse 83   Temp 98.2 F (36.8 C) (Oral)   Resp 18   LMP 10/09/2016 (Approximate)   SpO2 98%   Physical Exam Constitutional:      General: She is not in acute distress.    Appearance:  Normal appearance. She is well-developed. She is not ill-appearing, toxic-appearing or diaphoretic.  HENT:     Head: Normocephalic and atraumatic.     Nose: Nose normal.     Mouth/Throat:     Mouth: Mucous membranes are moist.     Pharynx: No pharyngeal swelling, oropharyngeal exudate, posterior oropharyngeal erythema or uvula swelling.     Tonsils: No tonsillar exudate or tonsillar abscesses. 0 on the right. 0 on the left.  Eyes:     General: No scleral icterus.       Right eye: No discharge.        Left eye: No discharge.     Extraocular Movements: Extraocular movements intact.  Cardiovascular:     Rate and Rhythm: Normal rate and regular rhythm.     Heart sounds: Normal heart sounds. No murmur heard.    No friction rub. No gallop.  Pulmonary:     Effort: Pulmonary effort is normal. No respiratory distress.     Breath sounds: No stridor. No wheezing, rhonchi or rales.  Chest:     Chest wall: No tenderness.  Skin:    General: Skin is warm and dry.  Neurological:     General: No focal deficit present.     Mental Status: She is alert and oriented to person, place, and time.  Psychiatric:        Mood and Affect: Mood normal.        Behavior: Behavior normal.     Assessment and Plan :   PDMP not reviewed this encounter.  1. Viral respiratory infection   2. Mild persistent reactive airway disease without complication    Offered imaging but patient declined. Will manage for supportive care and use a round of prednisone  for her reactive airway. Counseled patient on potential for adverse effects with medications prescribed/recommended today, ER and return-to-clinic precautions discussed, patient verbalized understanding.    Christopher Savannah, PA-C 06/16/24 1226

## 2024-06-16 NOTE — Discharge Instructions (Signed)
 We will manage this as a viral respiratory infection. For sore throat or cough try using a honey-based tea. Use 3 teaspoons of honey with juice squeezed from half lemon. Place shaved pieces of ginger into 1/2-1 cup of water and warm over stove top. Then mix the ingredients and repeat every 4 hours as needed. Please take Tylenol  650mg  once every 6 hours for fevers, aches and pains. Hydrate very well with at least 2 liters (64 ounces) of water. Eat light meals such as soups (chicken and noodles, chicken wild rice, vegetable).  Do not eat any foods that you are allergic to.  Start an antihistamine like Zyrtec (10mg  daily) for postnasal drainage, sinus congestion.  You can take this together with prednisone  for a respiratory boost. Keep using your albuterol inhaler. Use cough syrup as needed.

## 2024-06-16 NOTE — ED Triage Notes (Signed)
 Pt reports cough on and off x 3 days. Cough is worse in the morning and middle of the night as she wakes up due to cough. Mucinex, Ventolin, Robitussin gives some relief.

## 2024-07-10 ENCOUNTER — Other Ambulatory Visit: Payer: Self-pay

## 2024-07-10 ENCOUNTER — Encounter (HOSPITAL_COMMUNITY): Payer: Self-pay | Admitting: Emergency Medicine

## 2024-07-10 ENCOUNTER — Ambulatory Visit
Admission: EM | Admit: 2024-07-10 | Discharge: 2024-07-10 | Disposition: A | Discharge status: Home / Self Care | Attending: Family Medicine | Admitting: Family Medicine

## 2024-07-10 ENCOUNTER — Emergency Department (HOSPITAL_COMMUNITY)
Admission: EM | Admit: 2024-07-10 | Discharge: 2024-07-11 | Disposition: A | Discharge status: Home / Self Care | Attending: Emergency Medicine | Admitting: Emergency Medicine

## 2024-07-10 DIAGNOSIS — R1031 Right lower quadrant pain: Secondary | ICD-10-CM | POA: Diagnosis not present

## 2024-07-10 DIAGNOSIS — R197 Diarrhea, unspecified: Secondary | ICD-10-CM | POA: Diagnosis present

## 2024-07-10 DIAGNOSIS — Z91040 Latex allergy status: Secondary | ICD-10-CM | POA: Insufficient documentation

## 2024-07-10 DIAGNOSIS — E876 Hypokalemia: Secondary | ICD-10-CM | POA: Diagnosis not present

## 2024-07-10 LAB — URINALYSIS, ROUTINE W REFLEX MICROSCOPIC
Bilirubin Urine: NEGATIVE
Glucose, UA: NEGATIVE mg/dL
Hgb urine dipstick: NEGATIVE
Ketones, ur: NEGATIVE mg/dL
Nitrite: NEGATIVE
Protein, ur: NEGATIVE mg/dL
Specific Gravity, Urine: 1.025 (ref 1.005–1.030)
pH: 5 (ref 5.0–8.0)

## 2024-07-10 LAB — COMPREHENSIVE METABOLIC PANEL WITH GFR
ALT: 30 U/L (ref 0–44)
AST: 29 U/L (ref 15–41)
Albumin: 4.2 g/dL (ref 3.5–5.0)
Alkaline Phosphatase: 23 U/L — ABNORMAL LOW (ref 38–126)
Anion gap: 10 (ref 5–15)
BUN: 10 mg/dL (ref 6–20)
CO2: 23 mmol/L (ref 22–32)
Calcium: 9.6 mg/dL (ref 8.9–10.3)
Chloride: 105 mmol/L (ref 98–111)
Creatinine, Ser: 0.79 mg/dL (ref 0.44–1.00)
GFR, Estimated: >60 mL/min (ref >60)
Glucose, Bld: 92 mg/dL (ref 70–99)
Potassium: 3.4 mmol/L — ABNORMAL LOW (ref 3.5–5.1)
Sodium: 138 mmol/L (ref 135–145)
Total Bilirubin: 1.2 mg/dL (ref 0.0–1.2)
Total Protein: 8.6 g/dL — ABNORMAL HIGH (ref 6.5–8.1)

## 2024-07-10 LAB — CBC
HCT: 44.7 % (ref 36.0–46.0)
Hemoglobin: 13.6 g/dL (ref 12.0–15.0)
MCH: 26.5 pg (ref 26.0–34.0)
MCHC: 30.4 g/dL (ref 30.0–36.0)
MCV: 87.1 fL (ref 80.0–100.0)
Platelets: 244 K/uL (ref 150–400)
RBC: 5.13 MIL/uL — ABNORMAL HIGH (ref 3.87–5.11)
RDW: 13.8 % (ref 11.5–15.5)
WBC: 5.1 K/uL (ref 4.0–10.5)
nRBC: 0 % (ref 0.0–0.2)

## 2024-07-10 LAB — LIPASE, BLOOD: Lipase: 28 U/L (ref 11–51)

## 2024-07-10 NOTE — ED Provider Notes (Signed)
 [facility]    CSN: [id number] Arrival date & time: 07/10/24  1425      History   Chief Complaint Chief Complaint  Patient presents with   Diarrhea    HPI Cassidy Mayer is a 58 y.o. female with a past medical history of breast cancer, eczema presents for diarrhea and abdominal pain.  Patient reports she states she began having multiple episodes of nonbloody liquidy diarrhea.  States she has had upwards of 20 episodes in the past 24 hours.  She does endorse abdominal pain primarily prior to onset of the diarrhea.  She denies any nausea/vomiting, fevers or chills, dysuria.  No recent travel.  No sick contacts.  No new medications.  No history of GI diagnoses such as Crohn's, IBS, colitis, diverticulitis.  Has a history of tubal ligation but no other abdominal surgeries.  She has been taking Imodium with minimal improvement.  She states she is struggling to stay hydrated.  No other concerns at this time   Diarrhea Associated symptoms: abdominal pain     Past Medical History:  Diagnosis Date   Anxiety    Breast mass, right    Eczema    History of radiation therapy 09/21/16- 10/18/16   Right Breast 40.05 Gy in 15 fractions. Right Breast boost of 5 fractions.    Personal history of radiation therapy 10/17-11/17    Patient Active Problem List   Diagnosis Date Noted   Carcinoma of upper-outer quadrant of right breast in female, estrogen receptor positive (HCC) 09/06/2016    Past Surgical History:  Procedure Laterality Date   BREAST BIOPSY Right 06/20/2016   malignant   BREAST LUMPECTOMY Right 08/11/2016   BREAST LUMPECTOMY WITH RADIOACTIVE SEED LOCALIZATION Right 08/11/2016   Procedure: RIGHT BREAST LUMPECTOMY WITH RADIOACTIVE SEED LOCALIZATION;  Surgeon: Deward Null III, MD;  Location: [facility];  Service: General;  Laterality: Right;   ELBOW SURGERY     TUBAL LIGATION      OB History   No obstetric history on file.      Home Medications     Prior to Admission medications   Medication Sig Start Date End Date Taking? Authorizing Provider  acetaminophen  (TYLENOL ) 500 MG tablet Take 1,000 mg by mouth every 6 (six) hours as needed (pain).    [provider]  albuterol (VENTOLIN HFA) 108 (90 Base) MCG/ACT inhaler Inhale 2 puffs into the lungs. 04/08/21   [provider]  amitriptyline (ELAVIL) 25 MG tablet Take 75 mg by mouth. 10/10/22   [provider]  amLODipine (NORVASC) 5 MG tablet Take 5 mg by mouth daily.    [provider]  anastrozole  (ARIMIDEX ) 1 MG tablet Take 1 tablet (1 mg total) by mouth daily. 10/11/18   Gudena, Vinay, MD  Calcium Carb-Cholecalciferol (CALCIUM-VITAMIN D) 600-400 MG-UNIT TABS Take by mouth.    [provider]  Calcium Carbonate-Vit D-Min (CALCIUM 600+D PLUS MINERALS) 600-400 MG-UNIT TABS Take by mouth. 11/16/22   [provider]  Cholecalciferol (VITAMIN D3) 2000 units TABS Take 1,000 Units by mouth daily. 04/04/17   Gudena, Vinay, MD  CLOBETASOL PROPIONATE EX Apply 1 application topically every other day.     [provider]  dextromethorphan-guaiFENesin (MUCINEX DM) 30-600 MG 12hr tablet Take 1 tablet by mouth 2 (two) times daily.    [provider]  gabapentin (NEURONTIN) 600 MG tablet Take 600 mg by mouth at bedtime.    [provider]  guaifenesin (ROBITUSSIN) 100 MG/5ML syrup Take  200 mg by mouth 3 (three) times daily as needed for cough.    [provider]  ibuprofen (ADVIL,MOTRIN) 200 MG tablet Take 200 mg by mouth every 6 (six) hours as needed (pain).    [provider]  magnesium 30 MG tablet Take 30 mg by mouth 3 (three) times a week.     [provider]  predniSONE  (DELTASONE ) 20 MG tablet Take 2 tablets daily with breakfast. 06/16/24   Christopher Savannah, PA-C  promethazine -dextromethorphan (PROMETHAZINE -DM) 6.25-15 MG/5ML syrup Take 5 mLs by mouth 3 (three) times daily as needed for cough. 06/16/24    Christopher Savannah, PA-C  Turmeric 500 MG CAPS Take 500 mg by mouth daily.    [provider]  UNABLE TO FIND Med Name: Dermasmooth, spray for hair. Used every other night    [provider]    Family History Family History  Problem Relation Age of Onset   Breast cancer Neg Hx    Colon cancer Neg Hx    Prostate cancer Neg Hx    Lung cancer Neg Hx     Social History Social History   Tobacco Use   Smoking status: Former    Current packs/day: 0.25    Types: Cigars, Cigarettes   Smokeless tobacco: Never   Tobacco comments:    smokes black and milds  Vaping Use   Vaping status: Never Used  Substance Use Topics   Alcohol use: No   Drug use: No     Allergies   Latex   Review of Systems Review of Systems  Gastrointestinal:  Positive for abdominal pain and diarrhea.     Physical Exam Triage Vital Signs ED Triage Vitals  Encounter Vitals Group     BP 07/10/24 1444 (!) 145/88     Girls Systolic BP Percentile --      Girls Diastolic BP Percentile --      Boys Systolic BP Percentile --      Boys Diastolic BP Percentile --      Pulse Rate 07/10/24 1444 74     Resp 07/10/24 1444 16     Temp 07/10/24 1444 98.2 F (36.8 C)     Temp Source 07/10/24 1444 Oral     SpO2 07/10/24 1444 97 %     Weight --      Height --      Head Circumference --      Peak Flow --      Pain Score 07/10/24 1457 0     Pain Loc --      Pain Education --      Exclude from Growth Chart --    No data found.  Updated Vital Signs BP (!) 145/88 (BP Location: Right Arm)   Pulse 74   Temp 98.2 F (36.8 C) (Oral)   Resp 16   LMP 10/09/2016 (Approximate)   SpO2 97%   Visual Acuity Right Eye Distance:   Left Eye Distance:   Bilateral Distance:    Right Eye Near:   Left Eye Near:    Bilateral Near:     Physical Exam Vitals and nursing note reviewed.  Constitutional:      General: She is not in acute distress.    Appearance: Normal appearance. She is not ill-appearing.   HENT:     Head: Normocephalic and atraumatic.  Eyes:     Pupils: Pupils are equal, round, and reactive to light.  Cardiovascular:     Rate and Rhythm: Normal rate.  Pulmonary:     Effort: Pulmonary effort is normal.  Abdominal:     General: Bowel sounds are increased.     Palpations: Abdomen is soft.     Tenderness: There is abdominal tenderness in the right lower quadrant and left lower quadrant. There is no guarding or rebound. Positive signs include McBurney's sign. Negative signs include Rovsing's sign.  Skin:    General: Skin is warm and dry.  Neurological:     General: No focal deficit present.     Mental Status: She is alert and oriented to person, place, and time.  Psychiatric:        Mood and Affect: Mood normal.        Behavior: Behavior normal.      UC Treatments / Results  Labs (all labs ordered are listed, but only abnormal results are displayed) Labs Reviewed - No data to display  EKG   Radiology No results found.  Procedures Procedures (including critical care time)  Medications Ordered in UC Medications - No data to display  Initial Impression / Assessment and Plan / UC Course  I have reviewed the triage vital signs and the nursing notes.  Pertinent labs & imaging results that were available during my care of the patient were reviewed by me and considered in my medical decision making (see chart for details).     Reviewed exam and symptoms with patient.  Discussed limitations and abilities of urgent care.  Given patient abdominal exam and severe diarrhea advised to go to the emergency room for further evaluation and workup.  She is in agreement with plan will go POV to the emergency room. Final Clinical Impressions(s) / UC Diagnoses   Final diagnoses:  RLQ abdominal pain  Diarrhea, unspecified type   Discharge Instructions   None    ED Prescriptions   None    PDMP not reviewed this encounter.   Loreda Myla SAUNDERS, NP 07/10/24 [phone]

## 2024-07-10 NOTE — ED Notes (Signed)
 Patient is being discharged from the Urgent Care and sent to the Emergency Department via POV . Per Myla Bold NP, patient is in need of higher level of care due to abdominal pain. Patient is aware and verbalizes understanding of plan of care.  Vitals:   07/10/24 1444  BP: (!) 145/88  Pulse: 74  Resp: 16  Temp: 98.2 F (36.8 C)  SpO2: 97%

## 2024-07-10 NOTE — ED Triage Notes (Signed)
 Pt presents to the ED via POV with complaints of  diarrhea x 1 day. She notes taking Imodium without relief - seen at [facility] and advised to come in to rule out dehydration. A&Ox4 at this time. Denies abdominal pain, hematuria, bloody stool, CP or SOB.

## 2024-07-10 NOTE — ED Triage Notes (Signed)
 Pt present with diarrhea x yesterday. Pt states she has stomach pain only when going to the bathroom.

## 2024-07-11 ENCOUNTER — Emergency Department (HOSPITAL_COMMUNITY)

## 2024-07-11 LAB — LACTIC ACID, PLASMA: Lactic Acid, Venous: 0.8 mmol/L (ref 0.5–1.9)

## 2024-07-11 MED ORDER — ONDANSETRON 4 MG PO TBDP: 4.0000 mg | ORAL_TABLET | Freq: Three times a day (TID) | ORAL | 0 refills | Status: AC | PRN

## 2024-07-11 MED ORDER — KETOROLAC TROMETHAMINE 30 MG/ML IJ SOLN
15.0000 mg | Freq: Once | INTRAMUSCULAR | Status: AC
  Administered 2024-07-11: 15 mg via INTRAVENOUS
  Filled 2024-07-11: qty 1

## 2024-07-11 MED ORDER — LACTATED RINGERS IV BOLUS
1000.0000 mL | Freq: Once | INTRAVENOUS | Status: AC
  Administered 2024-07-11: 1000 mL via INTRAVENOUS

## 2024-07-11 MED ORDER — IOHEXOL 300 MG/ML  SOLN
100.0000 mL | Freq: Once | INTRAMUSCULAR | Status: AC | PRN
  Administered 2024-07-11: 100 mL via INTRAVENOUS

## 2024-07-11 NOTE — ED Provider Notes (Signed)
 [place] EMERGENCY DEPARTMENT AT [facility] Provider Note   CSN: [id number] Arrival date & time: 07/10/24  2010     Patient presents with: Diarrhea   Cassidy Mayer is a 58 y.o. female.   Patient with history of anxiety breast mass here with diarrhea x 3 days.  States she was sent from urgent care because of right-sided lower abdominal pain.  Has been having loose stools that have been yellow and brown and nonbloody up to 10 times daily for the past 2 to 3 days.  Denies any blood in the stool.  No nausea or vomiting.  No fever.  Does have pain when she attempts to go to the bathroom but this resolves.  No fever.  Poor appetite.  No nausea or vomiting.  No travel or sick contacts.  No recent antibiotic use.  Still has appendix and gallbladder.  No previous abdominal surgeries.  No history of similar.  Several urgent care because she was having right-sided lower abdominal pain and they were concerned about dehydration.  Denies any GI history.  No history of ulcerative colitis or Crohn's disease.  The history is provided by the patient.  Diarrhea Associated symptoms: abdominal pain and vomiting   Associated symptoms: no arthralgias, no fever, no headaches and no myalgias        Prior to Admission medications   Medication Sig Start Date End Date Taking? Authorizing Provider  acetaminophen  (TYLENOL ) 500 MG tablet Take 1,000 mg by mouth every 6 (six) hours as needed (pain).    [provider]  albuterol (VENTOLIN HFA) 108 (90 Base) MCG/ACT inhaler Inhale 2 puffs into the lungs. 04/08/21   [provider]  amitriptyline (ELAVIL) 25 MG tablet Take 75 mg by mouth. 10/10/22   [provider]  amLODipine (NORVASC) 5 MG tablet Take 5 mg by mouth daily.    [provider]  anastrozole  (ARIMIDEX ) 1 MG tablet Take 1 tablet (1 mg total) by mouth daily. 10/11/18   Gudena, Vinay, MD  Calcium Carb-Cholecalciferol (CALCIUM-VITAMIN D) 600-400 MG-UNIT TABS  Take by mouth.    [provider]  Calcium Carbonate-Vit D-Min (CALCIUM 600+D PLUS MINERALS) 600-400 MG-UNIT TABS Take by mouth. 11/16/22   [provider]  Cholecalciferol (VITAMIN D3) 2000 units TABS Take 1,000 Units by mouth daily. 04/04/17   Gudena, Vinay, MD  CLOBETASOL PROPIONATE EX Apply 1 application topically every other day.     [provider]  dextromethorphan-guaiFENesin (MUCINEX DM) 30-600 MG 12hr tablet Take 1 tablet by mouth 2 (two) times daily.    [provider]  gabapentin (NEURONTIN) 600 MG tablet Take 600 mg by mouth at bedtime.    [provider]  guaifenesin (ROBITUSSIN) 100 MG/5ML syrup Take 200 mg by mouth 3 (three) times daily as needed for cough.    [provider]  ibuprofen (ADVIL,MOTRIN) 200 MG tablet Take 200 mg by mouth every 6 (six) hours as needed (pain).    [provider]  magnesium 30 MG tablet Take 30 mg by mouth 3 (three) times a week.     [provider]  predniSONE  (DELTASONE ) 20 MG tablet Take 2 tablets daily with breakfast. 06/16/24   Christopher Savannah, PA-C  promethazine -dextromethorphan (PROMETHAZINE -DM) 6.25-15 MG/5ML syrup Take 5 mLs by mouth 3 (three) times daily as needed for cough. 06/16/24   Christopher Savannah, PA-C  Turmeric 500 MG CAPS Take 500 mg by mouth daily.    [provider]  UNABLE TO FIND Med Name:  Dermasmooth, spray for hair. Used every other night    [provider]    Allergies: Latex    Review of Systems  Constitutional:  Positive for activity change and appetite change. Negative for fatigue and fever.  HENT:  Negative for congestion and rhinorrhea.   Respiratory:  Negative for chest tightness and shortness of breath.   Cardiovascular:  Negative for chest pain.  Gastrointestinal:  Positive for abdominal pain, diarrhea and vomiting.  Genitourinary:  Negative for dysuria and hematuria.  Musculoskeletal:  Negative for arthralgias and myalgias.  Neurological:   Negative for dizziness, weakness and headaches.   all other systems are negative except as noted in the HPI and PMH.    Updated Vital Signs BP 125/86   Pulse 63   Temp 97.6 F (36.4 C)   Resp 18 Comment: Simultaneous filing. User may not have seen previous data.  Ht 5' 5 (1.651 m)   Wt 71.2 kg   LMP 10/09/2016 (Approximate)   SpO2 100%   BMI 26.13 kg/m   Physical Exam Vitals and nursing note reviewed.  Constitutional:      General: She is not in acute distress.    Appearance: She is well-developed.  HENT:     Head: Normocephalic and atraumatic.     Mouth/Throat:     Pharynx: No oropharyngeal exudate.  Eyes:     Conjunctiva/sclera: Conjunctivae normal.     Pupils: Pupils are equal, round, and reactive to light.  Neck:     Comments: No meningismus. Cardiovascular:     Rate and Rhythm: Normal rate and regular rhythm.     Heart sounds: Normal heart sounds. No murmur heard. Pulmonary:     Effort: Pulmonary effort is normal. No respiratory distress.     Breath sounds: Normal breath sounds.  Abdominal:     Palpations: Abdomen is soft.     Tenderness: There is abdominal tenderness. There is guarding. There is no rebound.     Comments: TTP RLQ with suprapubic and RLQ tenderness. No guarding or rebound  Musculoskeletal:        General: No tenderness. Normal range of motion.     Cervical back: Normal range of motion and neck supple.  Skin:    General: Skin is warm.  Neurological:     Mental Status: She is alert and oriented to person, place, and time.     Cranial Nerves: No cranial nerve deficit.     Motor: No abnormal muscle tone.     Coordination: Coordination normal.     Comments:  5/5 strength throughout. CN 2-12 intact.Equal grip strength.   Psychiatric:        Behavior: Behavior normal.     (all labs ordered are listed, but only abnormal results are displayed) Labs Reviewed  COMPREHENSIVE METABOLIC PANEL WITH GFR - Abnormal; Notable for the following  components:      Result Value   Potassium 3.4 (*)    Total Protein 8.6 (*)    Alkaline Phosphatase 23 (*)    All other components within normal limits  CBC - Abnormal; Notable for the following components:   RBC 5.13 (*)    All other components within normal limits  URINALYSIS, ROUTINE W REFLEX MICROSCOPIC - Abnormal; Notable for the following components:   APPearance HAZY (*)    Leukocytes,Ua TRACE (*)    Bacteria, UA MANY (*)    All other components within normal limits  C DIFFICILE QUICK SCREEN W PCR REFLEX    GASTROINTESTINAL  PANEL BY PCR, STOOL (REPLACES STOOL CULTURE)  URINE CULTURE  LIPASE, BLOOD  LACTIC ACID, PLASMA    EKG: None  Radiology: CT ABDOMEN PELVIS W CONTRAST Result Date: 07/11/2024 CLINICAL DATA:  Abdominal pain on the right, initial encounter EXAM: CT ABDOMEN AND PELVIS WITH CONTRAST TECHNIQUE: Multidetector CT imaging of the abdomen and pelvis was performed using the standard protocol following bolus administration of intravenous contrast. RADIATION DOSE REDUCTION: This exam was performed according to the departmental dose-optimization program which includes automated exposure control, adjustment of the mA and/or kV according to patient size and/or use of iterative reconstruction technique. CONTRAST:  OMNIPAQUE  IOHEXOL  300 MG/ML  SOLN COMPARISON:  10/17/2016 FINDINGS: Lower chest: No acute abnormality. Hepatobiliary: No focal liver abnormality is seen. No gallstones, gallbladder wall thickening, or biliary dilatation. Pancreas: Unremarkable. No pancreatic ductal dilatation or surrounding inflammatory changes. Spleen: Normal in size without focal abnormality. Adrenals/Urinary Tract: Adrenal glands are within normal limits. Kidneys are well visualized bilaterally. No renal calculi or obstructive changes seen. The bladder is decompressed. Stomach/Bowel: No obstructive or inflammatory changes of the colon are noted. The appendix is within normal limits. Small bowel  and stomach are unremarkable. Vascular/Lymphatic: Aortic atherosclerosis. No enlarged abdominal or pelvic lymph nodes. Reproductive: Uterus and bilateral adnexa are unremarkable. Other: No abdominal wall hernia or abnormality. No abdominopelvic ascites. Musculoskeletal: No acute abnormality noted. IMPRESSION: No acute abnormality to correspond with the given clinical history. Electronically Signed   By: Oneil Devonshire M.D.   On: 07/11/2024 01:52     Procedures   Medications Ordered in the ED  lactated ringers  bolus 1,000 mL (has no administration in time range)                                    Medical Decision Making Amount and/or Complexity of Data Reviewed Labs: ordered. Decision-making details documented in ED Course. Radiology: ordered and independent interpretation performed. Decision-making details documented in ED Course. ECG/medicine tests: ordered and independent interpretation performed. Decision-making details documented in ED Course.  Risk Prescription drug management.   3 days of diarrhea with right-sided lower abdominal pain.  No fever.  No vomiting.  No travel or sick contacts.  No recent use.  Stable vitals.  Abdomen soft with tenderness to right lower quadrant.  Labs in triage show no leukocytosis.  LFTs and lipase are normal.  Urinalysis does have bacteriuria.  She denies any urinary symptoms.  Will send for culture.  IV fluids given.  Unable to have any bowel movements in the ED.  CT scan is obtained and shows no acute pathology.  Normal appendix.  No bowel obstruction.  Results reviewed and interpreted by me.  Denies any urinary symptoms.  Will send for culture.  No leukocytosis or fever.  Discussed oral hydration at home, supportive care with Imodium CT follow-up.  She will attempt To give stool sample before discharge. Urine culture is pending.  Given her ongoing diarrhea would not start empiric antibiotics at this time given her lack of UTI  symptoms.  Discussed supportive care for her diarrhea, oral hydration at home, PCP follow-up.  Return precautions discussed including worsening abdominal pain, fever, unable to eat or drink or other concerns     Final diagnoses:  None    ED Discharge Orders     None          Abrahim Sargent, Garnette, MD 07/11/24 [phone]

## 2024-07-11 NOTE — Discharge Instructions (Signed)
 Testing is reassuring.  Your CT scan is normal.  Start with your liquid diet and advance slowly as tolerated.  Your urine shows white blood cells in it and is being sent for culture.  You will be called if the results are positive you need to start antibiotics.  Return to the ED with new or worsening symptoms.

## 2024-07-12 LAB — URINE CULTURE

## 2024-10-26 ENCOUNTER — Other Ambulatory Visit: Payer: Self-pay

## 2024-10-26 ENCOUNTER — Ambulatory Visit
Admission: EM | Admit: 2024-10-26 | Discharge: 2024-10-26 | Disposition: A | Discharge status: Home / Self Care | Attending: Family Medicine | Admitting: Family Medicine

## 2024-10-26 DIAGNOSIS — J069 Acute upper respiratory infection, unspecified: Secondary | ICD-10-CM

## 2024-10-26 DIAGNOSIS — R051 Acute cough: Secondary | ICD-10-CM | POA: Diagnosis not present

## 2024-10-26 LAB — POC COVID19/FLU A&B COMBO
Covid Antigen, POC: NEGATIVE
Influenza A Antigen, POC: NEGATIVE
Influenza B Antigen, POC: NEGATIVE

## 2024-10-26 MED ORDER — PROMETHAZINE-DM 6.25-15 MG/5ML PO SYRP: 5.0000 mL | ORAL_SOLUTION | Freq: Three times a day (TID) | ORAL | 0 refills | Status: AC | PRN

## 2024-10-26 NOTE — Discharge Instructions (Addendum)
 You tested negative for COVID and flu.  Please treat your symptoms with over the counter tylenol  or ibuprofen, humidifier, and rest.  You may take Promethazine  DM as needed for your cough.  Please of this medication make you drowsy.  Do not drink alcohol or drive while on this medication.  Viral illnesses can last 7-14 days. Please follow up with your PCP if your symptoms are not improving. Please go to the ER for any worsening symptoms. This includes but is not limited to fever you can not control with tylenol  or ibuprofen, you are not able to stay hydrated, you have shortness of breath or chest pain.  Thank you for choosing [facility] for your healthcare needs. I hope you feel better soon!

## 2024-10-26 NOTE — ED Provider Notes (Signed)
 [facility]    CSN: [id number] Arrival date & time: 10/26/24  0909      History   Chief Complaint No chief complaint on file.   HPI Cassidy Mayer is a 58 y.o. female  presents for evaluation of URI symptoms for 3 days. Patient reports associated symptoms of cough, congestion. Denies N/V/D, sore throat, fevers, ear pain, body aches, shortness of breath. Patient does not have a hx of asthma. Patient does vape.   Reports sick contacts via family.  Pt has taken TheraFlu OTC for symptoms. Pt has no other concerns at this time.   HPI  Past Medical History:  Diagnosis Date   Anxiety    Breast mass, right    Eczema    History of radiation therapy 09/21/16- 10/18/16   Right Breast 40.05 Gy in 15 fractions. Right Breast boost of 5 fractions.    Personal history of radiation therapy 10/17-11/17    Patient Active Problem List   Diagnosis Date Noted   Carcinoma of upper-outer quadrant of right breast in female, estrogen receptor positive (HCC) 09/06/2016    Past Surgical History:  Procedure Laterality Date   BREAST BIOPSY Right 06/20/2016   malignant   BREAST LUMPECTOMY Right 08/11/2016   BREAST LUMPECTOMY WITH RADIOACTIVE SEED LOCALIZATION Right 08/11/2016   Procedure: RIGHT BREAST LUMPECTOMY WITH RADIOACTIVE SEED LOCALIZATION;  Surgeon: Deward Null III, MD;  Location: [facility];  Service: General;  Laterality: Right;   ELBOW SURGERY     TUBAL LIGATION      OB History   No obstetric history on file.      Home Medications    Prior to Admission medications   Medication Sig Start Date End Date Taking? Authorizing Provider  promethazine -dextromethorphan (PROMETHAZINE -DM) 6.25-15 MG/5ML syrup Take 5 mLs by mouth 3 (three) times daily as needed for cough. 10/26/24  Yes Erika Slaby, Jodi R, NP  acetaminophen  (TYLENOL ) 500 MG tablet Take 1,000 mg by mouth every 6 (six) hours as needed (pain).    [provider]  albuterol (VENTOLIN HFA) 108 (90 Base)  MCG/ACT inhaler Inhale 2 puffs into the lungs. 04/08/21   [provider]  amitriptyline (ELAVIL) 25 MG tablet Take 75 mg by mouth. 10/10/22   [provider]  amLODipine (NORVASC) 5 MG tablet Take 5 mg by mouth in the morning and at bedtime.    [provider]  anastrozole  (ARIMIDEX ) 1 MG tablet Take 1 tablet (1 mg total) by mouth daily. 10/11/18   Gudena, Vinay, MD  Calcium Carb-Cholecalciferol (CALCIUM-VITAMIN D) 600-400 MG-UNIT TABS Take by mouth.    [provider]  Calcium Carbonate-Vit D-Min (CALCIUM 600+D PLUS MINERALS) 600-400 MG-UNIT TABS Take by mouth. 11/16/22   [provider]  Cholecalciferol (VITAMIN D3) 2000 units TABS Take 1,000 Units by mouth daily. 04/04/17   Gudena, Vinay, MD  CLOBETASOL PROPIONATE EX Apply 1 application topically every other day.     [provider]  gabapentin (NEURONTIN) 600 MG tablet Take 600 mg by mouth at bedtime.    [provider]  ibuprofen (ADVIL,MOTRIN) 200 MG tablet Take 200 mg by mouth every 6 (six) hours as needed (pain).    [provider]  magnesium 30 MG tablet Take 30 mg by mouth 3 (three) times a week.     [provider]  ondansetron  (ZOFRAN -ODT) 4 MG disintegrating tablet Take 1 tablet (4 mg total) by mouth every 8 (eight) hours as needed for nausea or vomiting. 07/11/24  Rancour, Garnette, MD  Turmeric 500 MG CAPS Take 500 mg by mouth daily.    [provider]  UNABLE TO FIND Med Name: Dermasmooth, spray for hair. Used every other night    [provider]    Family History Family History  Problem Relation Age of Onset   Breast cancer Neg Hx    Colon cancer Neg Hx    Prostate cancer Neg Hx    Lung cancer Neg Hx     Social History Social History   Tobacco Use   Smoking status: Former    Current packs/day: 0.25    Types: Cigars, Cigarettes   Smokeless tobacco: Never   Tobacco comments:    smokes black and milds  Vaping Use   Vaping  status: Never Used  Substance Use Topics   Alcohol use: Yes    Alcohol/week: 7.0 standard drinks of alcohol    Types: 7 Shots of liquor per week   Drug use: No     Allergies   Latex   Review of Systems Review of Systems  HENT:  Positive for congestion.   Respiratory:  Positive for cough.      Physical Exam Triage Vital Signs ED Triage Vitals  Encounter Vitals Group     BP 10/26/24 0923 128/77     Girls Systolic BP Percentile --      Girls Diastolic BP Percentile --      Boys Systolic BP Percentile --      Boys Diastolic BP Percentile --      Pulse Rate 10/26/24 0923 91     Resp 10/26/24 0923 17     Temp 10/26/24 0923 98.1 F (36.7 C)     Temp Source 10/26/24 0923 Oral     SpO2 10/26/24 0923 98 %     Weight --      Height --      Head Circumference --      Peak Flow --      Pain Score 10/26/24 0924 0     Pain Loc --      Pain Education --      Exclude from Growth Chart --    No data found.  Updated Vital Signs BP 128/77   Pulse 91   Temp 98.1 F (36.7 C) (Oral)   Resp 17   LMP 10/09/2016 (Approximate)   SpO2 98%   Visual Acuity Right Eye Distance:   Left Eye Distance:   Bilateral Distance:    Right Eye Near:   Left Eye Near:    Bilateral Near:     Physical Exam Vitals and nursing note reviewed.  Constitutional:      General: She is not in acute distress.    Appearance: She is well-developed. She is not ill-appearing.  HENT:     Head: Normocephalic and atraumatic.     Right Ear: Tympanic membrane and ear canal normal.     Left Ear: Tympanic membrane and ear canal normal.     Nose: Congestion present.     Mouth/Throat:     Mouth: Mucous membranes are moist.     Pharynx: Oropharynx is clear. Uvula midline. No oropharyngeal exudate or posterior oropharyngeal erythema.     Tonsils: No tonsillar exudate or tonsillar abscesses.  Eyes:     Conjunctiva/sclera: Conjunctivae normal.     Pupils: Pupils are equal, round, and reactive to light.   Cardiovascular:     Rate and Rhythm: Normal rate and regular rhythm.  Heart sounds: Normal heart sounds.  Pulmonary:     Effort: Pulmonary effort is normal.     Breath sounds: Normal breath sounds. No wheezing or rhonchi.  Musculoskeletal:     Cervical back: Normal range of motion and neck supple.  Lymphadenopathy:     Cervical: No cervical adenopathy.  Skin:    General: Skin is warm and dry.  Neurological:     General: No focal deficit present.     Mental Status: She is alert and oriented to person, place, and time.  Psychiatric:        Mood and Affect: Mood normal.        Behavior: Behavior normal.      UC Treatments / Results  Labs (all labs ordered are listed, but only abnormal results are displayed) Labs Reviewed  POC COVID19/FLU A&B COMBO    EKG   Radiology No results found.  Procedures Procedures (including critical care time)  Medications Ordered in UC Medications - No data to display  Initial Impression / Assessment and Plan / UC Course  I have reviewed the triage vital signs and the nursing notes.  Pertinent labs & imaging results that were available during my care of the patient were reviewed by me and considered in my medical decision making (see chart for details).     Reviewed exam and symptoms with patient.  No red flags.  Negative COVID and flu testing.  Discussed viral illness and symptomatic treatment.  Promethazine  DM as needed for cough.  Encourage rest fluids and PCP follow-up if symptoms do not improve.  ER precautions reviewed. Final Clinical Impressions(s) / UC Diagnoses   Final diagnoses:  Acute cough  Viral upper respiratory illness     Discharge Instructions      You tested negative for COVID and flu.  Please treat your symptoms with over the counter tylenol  or ibuprofen, humidifier, and rest.  You may take Promethazine  DM as needed for your cough.  Please of this medication make you drowsy.  Do not drink alcohol or drive  while on this medication.  Viral illnesses can last 7-14 days. Please follow up with your PCP if your symptoms are not improving. Please go to the ER for any worsening symptoms. This includes but is not limited to fever you can not control with tylenol  or ibuprofen, you are not able to stay hydrated, you have shortness of breath or chest pain.  Thank you for choosing [facility] for your healthcare needs. I hope you feel better soon!      ED Prescriptions     Medication Sig Dispense Auth. Provider   promethazine -dextromethorphan (PROMETHAZINE -DM) 6.25-15 MG/5ML syrup Take 5 mLs by mouth 3 (three) times daily as needed for cough. 118 mL Liliah Dorian, Jodi R, NP      PDMP not reviewed this encounter.   Loreda Myla SAUNDERS, NP 10/26/24 1021

## 2024-10-26 NOTE — ED Triage Notes (Signed)
 Pt c/o productive cough w/yellow mucous, nasal drainage and nasal congestionx3d.
